# Patient Record
Sex: Female | Born: 1970 | Race: White | Hispanic: No | Marital: Married | Smoking: Never smoker
Health system: Southern US, Community
[De-identification: ages and names within clinical notes are randomized; demographics above are authoritative.]

## PROBLEM LIST (undated history)

## (undated) DIAGNOSIS — G4730 Sleep apnea, unspecified: Secondary | ICD-10-CM

## (undated) DIAGNOSIS — E785 Hyperlipidemia, unspecified: Secondary | ICD-10-CM

## (undated) DIAGNOSIS — T7840XA Allergy, unspecified, initial encounter: Secondary | ICD-10-CM

## (undated) DIAGNOSIS — Z803 Family history of malignant neoplasm of breast: Secondary | ICD-10-CM

## (undated) DIAGNOSIS — F410 Panic disorder [episodic paroxysmal anxiety] without agoraphobia: Secondary | ICD-10-CM

## (undated) DIAGNOSIS — Z86010 Personal history of colonic polyps: Secondary | ICD-10-CM

## (undated) DIAGNOSIS — I10 Essential (primary) hypertension: Secondary | ICD-10-CM

## (undated) DIAGNOSIS — K219 Gastro-esophageal reflux disease without esophagitis: Secondary | ICD-10-CM

## (undated) DIAGNOSIS — F419 Anxiety disorder, unspecified: Secondary | ICD-10-CM

## (undated) DIAGNOSIS — R011 Cardiac murmur, unspecified: Secondary | ICD-10-CM

## (undated) HISTORY — DX: Hyperlipidemia, unspecified: E78.5

## (undated) HISTORY — DX: Personal history of colonic polyps: Z86.010

## (undated) HISTORY — PX: POLYPECTOMY: SHX149

## (undated) HISTORY — DX: Anxiety disorder, unspecified: F41.9

## (undated) HISTORY — DX: Panic disorder (episodic paroxysmal anxiety): F41.0

## (undated) HISTORY — PX: COLONOSCOPY: SHX174

## (undated) HISTORY — PX: DILATION AND CURETTAGE OF UTERUS: SHX78

## (undated) HISTORY — DX: Allergy, unspecified, initial encounter: T78.40XA

## (undated) HISTORY — DX: Gastro-esophageal reflux disease without esophagitis: K21.9

## (undated) HISTORY — DX: Sleep apnea, unspecified: G47.30

## (undated) HISTORY — DX: Family history of malignant neoplasm of breast: Z80.3

## (undated) HISTORY — DX: Essential (primary) hypertension: I10

## (undated) HISTORY — DX: Cardiac murmur, unspecified: R01.1

---

## 1998-01-22 ENCOUNTER — Other Ambulatory Visit: Admission: RE | Admit: 1998-01-22 | Discharge: 1998-01-22 | Payer: Self-pay | Admitting: Obstetrics & Gynecology

## 1998-01-22 ENCOUNTER — Inpatient Hospital Stay (HOSPITAL_COMMUNITY): Admission: AD | Admit: 1998-01-22 | Discharge: 1998-01-22 | Payer: Self-pay | Admitting: Obstetrics & Gynecology

## 1998-01-25 ENCOUNTER — Ambulatory Visit (HOSPITAL_COMMUNITY): Admission: RE | Admit: 1998-01-25 | Discharge: 1998-01-25 | Payer: Self-pay | Admitting: Obstetrics & Gynecology

## 1998-08-14 ENCOUNTER — Emergency Department (HOSPITAL_COMMUNITY): Admission: EM | Admit: 1998-08-14 | Discharge: 1998-08-14 | Payer: Self-pay | Admitting: Emergency Medicine

## 1998-08-15 ENCOUNTER — Emergency Department (HOSPITAL_COMMUNITY): Admission: EM | Admit: 1998-08-15 | Discharge: 1998-08-15 | Payer: Self-pay | Admitting: Emergency Medicine

## 1999-04-29 ENCOUNTER — Inpatient Hospital Stay (HOSPITAL_COMMUNITY): Admission: AD | Admit: 1999-04-29 | Discharge: 1999-04-29 | Payer: Self-pay | Admitting: Obstetrics and Gynecology

## 1999-06-07 ENCOUNTER — Other Ambulatory Visit: Admission: RE | Admit: 1999-06-07 | Discharge: 1999-06-07 | Payer: Self-pay | Admitting: Obstetrics & Gynecology

## 1999-09-28 ENCOUNTER — Inpatient Hospital Stay (HOSPITAL_COMMUNITY): Admission: AD | Admit: 1999-09-28 | Discharge: 1999-09-28 | Payer: Self-pay | Admitting: Obstetrics and Gynecology

## 1999-09-28 ENCOUNTER — Other Ambulatory Visit: Admission: RE | Admit: 1999-09-28 | Discharge: 1999-09-28 | Payer: Self-pay | Admitting: Obstetrics and Gynecology

## 1999-11-22 ENCOUNTER — Inpatient Hospital Stay (HOSPITAL_COMMUNITY): Admission: AD | Admit: 1999-11-22 | Discharge: 1999-11-22 | Payer: Self-pay | Admitting: Obstetrics & Gynecology

## 1999-11-30 ENCOUNTER — Inpatient Hospital Stay (HOSPITAL_COMMUNITY): Admission: AD | Admit: 1999-11-30 | Discharge: 1999-12-04 | Payer: Self-pay | Admitting: Obstetrics and Gynecology

## 1999-12-06 ENCOUNTER — Inpatient Hospital Stay (HOSPITAL_COMMUNITY): Admission: AD | Admit: 1999-12-06 | Discharge: 1999-12-09 | Payer: Self-pay | Admitting: Obstetrics & Gynecology

## 2000-06-13 ENCOUNTER — Other Ambulatory Visit: Admission: RE | Admit: 2000-06-13 | Discharge: 2000-06-13 | Payer: Self-pay | Admitting: Obstetrics and Gynecology

## 2001-06-14 ENCOUNTER — Other Ambulatory Visit: Admission: RE | Admit: 2001-06-14 | Discharge: 2001-06-14 | Payer: Self-pay | Admitting: Obstetrics and Gynecology

## 2002-07-21 ENCOUNTER — Inpatient Hospital Stay (HOSPITAL_COMMUNITY): Admission: AD | Admit: 2002-07-21 | Discharge: 2002-07-21 | Payer: Self-pay | Admitting: Obstetrics and Gynecology

## 2002-09-08 ENCOUNTER — Inpatient Hospital Stay (HOSPITAL_COMMUNITY): Admission: AD | Admit: 2002-09-08 | Discharge: 2002-09-08 | Payer: Self-pay | Admitting: Obstetrics and Gynecology

## 2002-09-19 ENCOUNTER — Inpatient Hospital Stay (HOSPITAL_COMMUNITY): Admission: AD | Admit: 2002-09-19 | Discharge: 2002-09-19 | Payer: Self-pay | Admitting: Obstetrics and Gynecology

## 2002-10-04 ENCOUNTER — Inpatient Hospital Stay (HOSPITAL_COMMUNITY): Admission: AD | Admit: 2002-10-04 | Discharge: 2002-10-06 | Payer: Self-pay | Admitting: Obstetrics and Gynecology

## 2003-01-07 ENCOUNTER — Other Ambulatory Visit: Admission: RE | Admit: 2003-01-07 | Discharge: 2003-01-07 | Payer: Self-pay | Admitting: Obstetrics and Gynecology

## 2005-06-19 ENCOUNTER — Other Ambulatory Visit: Admission: RE | Admit: 2005-06-19 | Discharge: 2005-06-19 | Payer: Self-pay | Admitting: Obstetrics and Gynecology

## 2008-03-03 ENCOUNTER — Encounter: Admission: RE | Admit: 2008-03-03 | Discharge: 2008-03-03 | Payer: Self-pay | Admitting: Family Medicine

## 2010-11-13 ENCOUNTER — Encounter: Payer: Self-pay | Admitting: Family Medicine

## 2011-03-10 NOTE — H&P (Signed)
NAMEDELENN, AHN                          ACCOUNT NO.:  [id number]   MEDICAL RECORD NO.:  [id number]                   PATIENT TYPE:  INP   LOCATION:  9162                                 FACILITY:  [facility]   PHYSICIAN:  Osborn Coho, M.D.                DATE OF BIRTH:  15-Jul-1971   DATE OF ADMISSION:  10/04/2002  DATE OF DISCHARGE:                                HISTORY & PHYSICAL   HISTORY OF PRESENT ILLNESS:  The patient is a 40 year old gravida 5, para 1-  0-3-1 at 38-5/7 weeks, who presents with uterine contractions every three  minutes for the last several hours.  She denies headache, visual symptoms,  epigastric pain, leaking or bleeding.  She reports positive fetal movement.   PREGNANCY RISKS:  The pregnancy is remarkable for:  1. PIH with elevated blood pressure beginning at 35 weeks.  She has been on     Aldomet 250 mg t.i.d.  2. History of PIH with last pregnancy at 28 weeks, on Aldomet; then     developed pre-eclampsia at 37 weeks.  3. Probable LGA with polyhydramnios at 36 weeks, and her growth in the 75th     through 90th percentile.  4. Rh negative with RhoGAM received.  5. Positive Group B strep.  6. History of anxiety disorder.  7. History of two SABs and one TAB.   PRENATAL LABORATORY DATA:  Blood type A negative.  Rh antibody negative.  VDRL nonreactive.  Rubella titer positive.  Hepatitis B surface antigen  negative.  HIV nonreactive.  Cystic fibrosis testing negative.  GC and  chlamydia culChlamydiae normal.  Glucose challenge was elevated.  Three-hour  GTT was within normal limits.  AFP was declined.  Hemoglobin _____________  was 13.7; it was within normal limits at 28 weeks.  EDC of October 13, 2002; was established by last menstrual period and was in agreement with  ultrasound at approximately 14 weeks.   HISTORY OF PRESENT PREGNANCY:  The patient entered care at approximately 10  weeks.  She had an ultrasound of approximately 14 weeks, secondary  to  history of two SABs.  AFP was declined.  She had to have a three-hour GTT  secondary to abnormal one hour GTT.  She began to have slight elevation of  blood pressure at 31 weeks, but no other significant findings; no labs were  out of normal limits.  Platelet count was 144 at 33 weeks.  Positive beta  strep was noted at 34 weeks.   The patient continued to have some issues of glycosuria and slightly  elevated two RPCs ; however, she delivered before follow-up with nutritional  management center could occur.   OBSTETRICAL HISTORY:  Denies anxiety.  The patient had a therapeutic  termination of pregnancy at nine weeks in 1997.  She had a miscarriage at 12  weeks, with a D&C by Dr. Pennie Rushing in 1999.  She had a spontaneous miscarriage  at four weeks without complication.  In 2001 she had a vaginal birth of a  female infant, weight 7 pounds even at 37 weeks; she was in labor for 24 hours  with epidural anesthesia and she was induced secondary to pre-eclampsia and  was on magnesium sulfate.  She also received RhoGAM in all of her previous  pregnancies.  She had an abnormal Pap during one of her previous  pregnancies, and repeat was normal.  She did note elevated blood pressure at  28 weeks, and was treated with Aldomet in her fourth pregnancy.  She had  elevated blood pressure at 28 weeks, was treated with Aldomet and then  developed pre-eclampsia at 37 weeks.  She was readmitted five days after  delivery for worsening of pre-eclampsia.  The patient has a history of  infertility with the previous pregnancy.   PAST MEDICAL HISTORY:  The patient has an anxiety disorder and is on Paxil.  The patient had her wisdom teeth removed at age 11; a TAB x 1 and D&C with a  SAB x1.   ALLERGIES:  CIPRO, which causes hives.  ALEVE, which causes a rash.   FAMILY HISTORY:  Maternal grandmother had an MI.  Maternal sister had  stroke.  Father and sister with history of hypertension.  Mother has type 2   diabetes adult-onset.  The patient's sister has seizures and a stroke.  The  patient's sister also has cancer of the eyes and mouth.  The patient's  mother is a smoker.   GENETIC HISTORY:  Unremarkable.   SOCIAL HISTORY:  The patient is married to the father of the baby.  He is  involved and supportive (name:  Tationa Stech).  The patient is a homemaker.  Her husband is self-employed.  They both have college educations.   PHYSICAL EXAMINATION:  VITAL SIGNS:  Blood pressure 158/109, 148/103.  Other  vital signs are stable.  HEENT:  Within normal limits.  LUNGS:  Bilateral breath sounds are clear.  HEART:  Regular rate and rhythm; without murmur.  BREASTS:  Soft and nontender.  ABDOMEN:  Fundal height is approximately 40 cm.  Estimated fetal weight is  7.5 to 8.5 pounds.  Uterine contractions are every 2-4 min in a consistent  pattern.  EXTREMITIES:  Within normal limits.  Deep tendon reflexes are 2-3+ with  clonus.  There is approximately no beats of clonus bilaterally.   LABORATORY DATA:  _________________ urine is negative for protein.  CBC  shows hemoglobin 13.8, hematocrit 40, WBC 9.4, platelet count 137.   MONITORING:  Fetal heart rate is reactive and no decelerations.  Uterine  contractions are every three minutes and moderate in quality.  Cervix is 5  cm, slightly posterior, 80% vertex and -2 station.  Bulging bag of water.   ASSESSMENT/IMPRESSION:  1. Intrauterine pregnancy at 38-5/7 weeks.  2. Hypertension.  3. Rh negative.  4. Possible LGA with polyhydramnios.   PLAN:  1. Admit to birthing suite for consult with Dr. Su Hilt as attending     physician.  2. Routine physician orders.  3. Check PIH labs.  4. The patient desires epidural; will place this ASAP.  5. Penicillin prophylaxis for standard TBS dosing.  6. Dr. Su Hilt will follow this patient.     Renaldo Reel Emilee Hero, C.N.M.                   Osborn Coho, M.D.   VLL/MEDQ  D:  10/04/2002  T:  10/04/2002   Job:  [id number]

## 2011-03-10 NOTE — H&P (Signed)
[facility]  Patient:    [name], [name]                       MRN: [id number] Adm. Date:  [id number] Attending:  Cleatrice Burke Dictator:   Mack Guise, C.N.M.                         History and Physical  HISTORY OF PRESENT ILLNESS:   [name] is a 40 year old gravida 4, para 1-0-3-1, status post spontaneous vaginal delivery on December 01, 1999 after induction for chronic hypertension exacerbated by preeclampsia and decreased platelets.  The patient was seen at home on December 06, 1999 by home health nurse with a blood  pressure of 160/110 and 3+ proteinuria and hyperreflexia.  [name] has been taking Aldomet 250 mg p.o. t.i.d. and is readmitted for magnesium sulfate treatment.  PHYSICAL EXAMINATION:  VITAL SIGNS:                  Her blood pressures have been from 140s-170/70s-100. Afebrile.  HEENT:                        Unremarkable.  She is not complaining of any headache.  Some blurred vision on admission, right greater than the left.  LUNGS:                        Clear.  HEART:                        Regular rate and rhythm.  EXTREMITIES:                  DTRs 3+ and one beat of clonus.  LABORATORY DATA:              Lab works finds uric acid 6.5, LDH 272.  WBC is 11.4, hemoglobin and hematocrit 11.8 and 33.6, platelet count 172,000.  SGOT is 36, SGPT is 47.  ASSESSMENT:                   1. Five days postpartum.                               2. Chronic hypertension exacerbated by preeclampsia.  PLAN:                         Admit per Dr. Brita Romp. Hudson-Fraley.  Magnesium sulfate 4 g IV bolus, then 2 g/hr.  Strict I&O.  Regular diet.  IV total 125 cc/hr. PIH panel.  Bedrest with bathroom privileges.  Twenty-four-hour urine for creatinine clearance and total protein.  Dr. Brita Romp. Hudson-Fraley to follow. DD:  12/07/99 TD:  12/07/99 Job: [id number]

## 2011-03-10 NOTE — H&P (Signed)
[name], [name]                          ACCOUNT NO.:  [id number]   MEDICAL RECORD NO.:  [id number]                   PATIENT TYPE:   LOCATION:                                       FACILITY:  [facility]   PHYSICIAN:  Janine Limbo, M.D.            DATE OF BIRTH:  1971-08-13   DATE OF ADMISSION:  10/06/2002  DATE OF DISCHARGE:                                HISTORY & PHYSICAL   HISTORY OF PRESENT ILLNESS:  The patient is a 40 year old female gravida 5,  para 1-0-3-1 who presents at [redacted] weeks gestation ([facility] October 13, 2002) for  induction of labor.  The patient has been followed at [facility] and Gynecology for this pregnancy that has been complicated by  second trimester pregnancy induced hypertension.  The patient is currently  taking Aldomet 250 mg t.i.d.  She has a history of pregnancy induced  hypertension and preeclampsia with her previous pregnancy.  She is Rh  negative and she received RhoGAM at [redacted] weeks gestation.  She has a history  of an anxiety disorder, but is doing quite well so far.  She has had a  positive beta Strep culture during this pregnancy and a positive beta Strep  culture with her prior pregnancy.   OBSTETRICAL HISTORY:  In 1989 the patient had a first trimester elective  pregnancy termination.  In 1997 and in 1999 the patient had a first  trimester miscarriage.  In 2001 the patient had a vaginal delivery of a 7  pound female infant at [redacted] weeks gestation.  That pregnancy was complicated by  preeclampsia.   ALLERGIES:  CIPRO causes hives and ALEVE causes a rash.   PAST MEDICAL HISTORY:  The patient has a history of pregnancy induced  hypertension as mentioned above.  Otherwise, she is quite healthy except for  her anxiety disorder.  The patient had her wisdom teeth removed at age 19.  She had a D&C associated with one of her miscarriages and her elective  pregnancy termination.   SOCIAL HISTORY:  The patient denies cigarette use,  alcohol use, and  recreational drug use.   REVIEW OF SYSTEMS:  Normal pregnancy complaints.   FAMILY HISTORY:  Noncontributory.   PHYSICAL EXAMINATION:  VITAL SIGNS:  Weight 226 pounds.  HEENT:  Within normal limits.  CHEST:  Clear.  HEART:  Regular rate and rhythm.  BREASTS:  Without masses.  ABDOMEN:  Gravid with a fundal height of 38 cm.  EXTREMITIES:  Grossly normal.  NEUROLOGIC:  Grossly normal.  PELVIC:  Cervix is 3 cm dilated, 75% effaced, and -3 in station.  The vertex  is presenting.   LABORATORY VALUES:  Blood type is A-.  Antibody screen negative.  VDRL  nonreactive.  Rubella immune.  HBSAG negative.  HIV nonreactive.  GC  negative.  Chlamydia negative.  Glucola screen was 153 but her three hour  GTT was within normal  limits.  Her third trimester beta Strep culture was  positive.   ASSESSMENT:  1. A [redacted] week gestation.  2. Pregnancy induced hypertension.  3. History of preeclampsia and pregnancy induced hypertension.  4. Blood type A-.  5. Anxiety disorder.   PLAN:  The patient will be admitted for induction of labor.  She understands  the indications for her procedure and she accepts the associated risks.  The  patient will receive beta Strep prophylaxis.                                               Janine Limbo, M.D.    AVS/MEDQ  D:  10/01/2002  T:  10/01/2002  Job:  [id number]

## 2011-03-10 NOTE — H&P (Signed)
[facility]  Patient:    [name], [name]                       MRN: [id number] Adm. Date:  [id number] Attending:  Pleas Koch Dictator:   Vance Gather Duplantis, C.N.M.                         History and Physical  HISTORY OF PRESENT ILLNESS:       Ms. Heinkel is a 40 year old married white female, gravida 4, para 0-0-3-0 who presents for induction of labor secondary to chronic hypertension exacerbated by preeclampsia.  She was seen in the office earlier today and at that time was noted to have a blood pressure of 150/100, protein +1 in a  catheter specimen, and no uterine contractions at that time.  She denies any nausea, vomiting, headaches, or visual disturbances.  She reports positive fetal movement.  Her pregnancy has been followed at [facility] by the MD  service and has been complicated by history of chronic hypertension, greater than 2 ABs, and Rh negative and GBS positive.  OBSTETRICAL HISTORY:              She is a gravida 4, para 0-0-3-0 who had an elective AB in 1989, a miscarriage in 1997, and subsequent miscarriage in 1999, and this current pregnancy.  She is Rh negative.  She reports having had RhoGAM in he past.  She has also had a history of infertility and required an HSG prior to this pregnancy.  ALLERGIES:                        CIPRO.  PAST MEDICAL HISTORY:             She reports having had the usual childhood disease.  She reports a history of hypertension throughout this pregnancy but did not have a history of hypertension prior to this pregnancy per her report.  She  reports a history of anxiety disorder in the past and not currently treated.  FAMILY HISTORY:                   Significant for maternal grandmother with heart attack and stroke, maternal father and sister with hypertension, maternal mother with type 2 diabetes, sister with seizures, sister with cancer, and mother is a  smoker.  Her  genetic history is negative.  SOCIAL HISTORY:                   She is married to [employer] who is involved and supportive.  He is involved full time as a Proofreader.  She is employed full time as a Runner, broadcasting/film/video.  They are of the [place] faith.  They deny any illicit drug use, alcohol, or smoking with this pregnancy.  PRENATAL LABORATORY DATA:         Her blood type is A negative.  Antibody screen was positive passively after having received RhoGAM; syphilis is nonreactive, rubella immune, hepatitis B surface antigen is negative, HIV is nonreactive, GC and chlamydia are both negative.  Pap had ______ with slight atypia.  Urine culture  was negative.  One-hour glucola was negative.  A 36-week beta Strep was positive.  PHYSICAL EXAMINATION:  VITAL SIGNS:                      Blood pressure in the hospital  initially was 167/105.  After an hour of observation, it came down to 144/95.  Her temperature is 97.9.  Her pulse is 124, and her respirations are 20.  HEENT:                            Grossly within normal limits.  HEART:                            Regular rhythm and rate.  CHEST:                            Clear.  BREASTS:                          Soft and nontender.  ABDOMEN:                          Gravid with no right upper quadrant tenderness noted.  Her uterine contractions are mild and irregular.  Her fetal heart rate s reactive and reassuring.  PELVIC:                           Fingertip, 50% vertex, -2.  EXTREMITIES:                      She has 3+ reflexes with no clonus and no significant edema.  LABORATORY DATA:                  Urine protein was 1+ in a catheter specimen at the office.  Other labs are currently pending.  ASSESSMENT:                       1. Intrauterine pregnancy at 37 weeks.                                   2. Chronic hypertension with preeclampsia                                      exacerbation.                                    3. Positive group B Streptococcus.  PLAN:                             Admit to [facility] for induction of labor with Cytotec per Dr. Trudie Reed who will follow the patient. DD:  11/30/99 TD:  11/30/99 Job: [id number]

## 2011-03-10 NOTE — Discharge Summary (Signed)
[facility]  Patient:    [name], [name]                       MRN: [id number] Adm. Date:  [id number] Disc. Date: [id number] Attending:  Pleas Koch Dictator:   Nigel Bridgeman, C.N.M.                           Discharge Summary  ADMITTING DIAGNOSES:          1. Intrauterine pregnancy at term.                               2. Chronic hypertension with superimposed                                  preeclampsia.                               3. Positive group B strep.  DISCHARGE DIAGNOSES:          1. Resolving preeclampsia.                               2. Chronic hypertension.  PROCEDURES:                   1. Cytotec induction.                               2. Artificial rupture of membranes.                               3. Normal spontaneous vaginal birth.                               4. Epidural anesthesia.                               5. Magnesium sulfate therapy.  HOSPITAL COURSE:              Ms. Karas is a 40 year old gravida 4 para 0-0-3-0 who presented for induction of labor on November 30, 1999 secondary to chronic hypertension exacerbated by preeclampsia.  Pregnancy had been remarkable for: 1. Chronic hypertension.  2. Infertility.  3. Three SABs.  4. Rh negative. 5. Group B strep positive.  On admission, patients blood pressure was 160/90, fetal heart rate was reactive.  Cervix was 1-2 cm, 60%, vertex, at a -1 station. There was Cytotec placed during the evening of November 30, 1999.  On the morning of December 01, 1999, cervix was 3, 90%.  Artificial rupture of membranes was accomplished.  Platelet count was 115.  Patient had an epidural placed and progressed well to fully dilated at approximately 5 p.m.  She eventually delivered, after approximately greater than three hours second stage of labor, a viable female by the name of Alex, weight 7 pounds even.  Fetal heart rate was reactive throughout.  Apgars were 2, 7, and 8, with  the fetus having minimal respiratory  effort and poor heart rate at delivery.  The infant responded well to immediate  resuscitative efforts and was taken to the newborn nursery in good condition. Circumcision was planned.  Magnesium sulfate therapy had been initiated for preeclampsia since the patient had proteinuria.  This was continued for 24 hours. On day #1 post delivery, her blood pressure was 155/95, her hemoglobin was 12.1, her platelet count was 97,000.  Urine output was adequate.  Reflexes were 4+ with two beats of clonus.  There was 1-2+ edema.  Magnesium sulfate therapy was continued at that time, but was discontinued on December 03, 1999, at which time reflexes were normal.  The decision was made to observe her blood pressure to determine if continuing of her predelivery Aldomet dosing was required.  On postpartum day #2, patient was up ad lib.  She was breast feeding.  She had no headache, visual symptoms, or epigastric pain.  Her blood pressure in the morning was 160/100.  It had been ranging from 140/80 to 160/100.  She was afebrile. Her physical exam was within normal limits.  Her reflexes were within normal limits. Consultation was made with Dr. Stefano Gaul, and a decision was made to continue her Aldomet 250 mg p.o. t.i.d.  She was deemed to be ready for discharge, and was discharged home on December 04, 1999.  DISCHARGE INSTRUCTIONS:       Per [facility] handout.  Review of preeclampsia signs and symptoms was undertaken.  DISCHARGE MEDICATIONS:        1. Motrin 600 mg p.o. q.6h. p.r.n. pain.                               2. Aldomet 250 mg p.o. t.i.d.  DISCHARGE FOLLOW-UP:          Will occur on December 09, 1999 in the office for a blood pressure check, and at six weeks for routine postpartum exam. DD:  12/04/99 TD:  12/05/99 Job: [id number]

## 2011-03-27 ENCOUNTER — Inpatient Hospital Stay (HOSPITAL_COMMUNITY)
Admission: AD | Admit: 2011-03-27 | Discharge: 2011-03-27 | Disposition: A | Discharge status: Home / Self Care | Payer: BC Managed Care – PPO | Source: Ambulatory Visit | Attending: Obstetrics and Gynecology | Admitting: Obstetrics and Gynecology

## 2011-03-27 DIAGNOSIS — Z298 Encounter for other specified prophylactic measures: Secondary | ICD-10-CM | POA: Insufficient documentation

## 2011-03-27 DIAGNOSIS — Z3480 Encounter for supervision of other normal pregnancy, unspecified trimester: Secondary | ICD-10-CM | POA: Insufficient documentation

## 2011-03-27 DIAGNOSIS — Z8759 Personal history of other complications of pregnancy, childbirth and the puerperium: Secondary | ICD-10-CM | POA: Insufficient documentation

## 2011-03-27 DIAGNOSIS — Z2989 Encounter for other specified prophylactic measures: Secondary | ICD-10-CM | POA: Insufficient documentation

## 2011-03-28 LAB — RH IMMUNE GLOBULIN WORKUP (NOT WOMEN'S HOSP): ABO/RH(D): A NEG

## 2011-09-04 ENCOUNTER — Other Ambulatory Visit: Payer: Self-pay | Admitting: Obstetrics and Gynecology

## 2011-09-04 DIAGNOSIS — Z1231 Encounter for screening mammogram for malignant neoplasm of breast: Secondary | ICD-10-CM

## 2011-09-29 ENCOUNTER — Ambulatory Visit
Admission: RE | Admit: 2011-09-29 | Discharge: 2011-09-29 | Disposition: A | Discharge status: Home / Self Care | Payer: BC Managed Care – PPO | Source: Ambulatory Visit | Attending: Obstetrics and Gynecology | Admitting: Obstetrics and Gynecology

## 2011-09-29 DIAGNOSIS — Z1231 Encounter for screening mammogram for malignant neoplasm of breast: Secondary | ICD-10-CM

## 2011-11-25 ENCOUNTER — Encounter: Payer: Self-pay | Admitting: Obstetrics and Gynecology

## 2011-11-25 DIAGNOSIS — Z8759 Personal history of other complications of pregnancy, childbirth and the puerperium: Secondary | ICD-10-CM

## 2012-08-30 ENCOUNTER — Other Ambulatory Visit: Payer: Self-pay | Admitting: Obstetrics and Gynecology

## 2012-08-30 DIAGNOSIS — Z1231 Encounter for screening mammogram for malignant neoplasm of breast: Secondary | ICD-10-CM

## 2012-10-08 ENCOUNTER — Ambulatory Visit
Admission: RE | Admit: 2012-10-08 | Discharge: 2012-10-08 | Disposition: A | Discharge status: Home / Self Care | Payer: BC Managed Care – PPO | Source: Ambulatory Visit | Attending: Obstetrics and Gynecology | Admitting: Obstetrics and Gynecology

## 2012-10-08 DIAGNOSIS — Z1231 Encounter for screening mammogram for malignant neoplasm of breast: Secondary | ICD-10-CM

## 2013-07-14 ENCOUNTER — Ambulatory Visit (HOSPITAL_COMMUNITY): Payer: BC Managed Care – PPO

## 2013-09-03 ENCOUNTER — Other Ambulatory Visit: Payer: Self-pay

## 2013-09-03 DIAGNOSIS — Z1231 Encounter for screening mammogram for malignant neoplasm of breast: Secondary | ICD-10-CM

## 2013-10-09 ENCOUNTER — Ambulatory Visit
Admission: RE | Admit: 2013-10-09 | Discharge: 2013-10-09 | Disposition: A | Discharge status: Home / Self Care | Payer: BC Managed Care – PPO | Source: Ambulatory Visit

## 2013-10-09 DIAGNOSIS — Z1231 Encounter for screening mammogram for malignant neoplasm of breast: Secondary | ICD-10-CM

## 2014-08-03 ENCOUNTER — Other Ambulatory Visit: Payer: Self-pay

## 2014-08-03 DIAGNOSIS — Z1239 Encounter for other screening for malignant neoplasm of breast: Secondary | ICD-10-CM

## 2014-10-12 ENCOUNTER — Ambulatory Visit
Admission: RE | Admit: 2014-10-12 | Discharge: 2014-10-12 | Disposition: A | Discharge status: Home / Self Care | Payer: BC Managed Care – PPO | Source: Ambulatory Visit

## 2014-10-12 ENCOUNTER — Ambulatory Visit: Payer: BC Managed Care – PPO

## 2014-10-12 DIAGNOSIS — Z1239 Encounter for other screening for malignant neoplasm of breast: Secondary | ICD-10-CM

## 2015-08-31 ENCOUNTER — Other Ambulatory Visit: Payer: Self-pay

## 2015-08-31 DIAGNOSIS — Z1231 Encounter for screening mammogram for malignant neoplasm of breast: Secondary | ICD-10-CM

## 2015-10-14 ENCOUNTER — Ambulatory Visit: Payer: BC Managed Care – PPO

## 2015-11-01 ENCOUNTER — Ambulatory Visit
Admission: RE | Admit: 2015-11-01 | Discharge: 2015-11-01 | Disposition: A | Discharge status: Home / Self Care | Payer: BC Managed Care – PPO | Source: Ambulatory Visit

## 2015-11-01 DIAGNOSIS — Z1231 Encounter for screening mammogram for malignant neoplasm of breast: Secondary | ICD-10-CM

## 2016-09-18 ENCOUNTER — Other Ambulatory Visit: Payer: Self-pay | Admitting: Obstetrics and Gynecology

## 2016-09-18 DIAGNOSIS — Z1231 Encounter for screening mammogram for malignant neoplasm of breast: Secondary | ICD-10-CM

## 2016-11-01 ENCOUNTER — Ambulatory Visit
Admission: RE | Admit: 2016-11-01 | Discharge: 2016-11-01 | Disposition: A | Discharge status: Home / Self Care | Payer: BC Managed Care – PPO | Source: Ambulatory Visit | Attending: Obstetrics and Gynecology | Admitting: Obstetrics and Gynecology

## 2016-11-01 DIAGNOSIS — Z1231 Encounter for screening mammogram for malignant neoplasm of breast: Secondary | ICD-10-CM

## 2016-11-29 ENCOUNTER — Telehealth: Payer: Self-pay

## 2016-11-29 NOTE — Telephone Encounter (Signed)
SENT NOTE TO SCHEDULING 

## 2017-01-03 ENCOUNTER — Ambulatory Visit: Payer: BC Managed Care – PPO | Admitting: Interventional Cardiology

## 2017-01-03 ENCOUNTER — Encounter: Payer: Self-pay | Admitting: Interventional Cardiology

## 2017-01-11 DIAGNOSIS — R002 Palpitations: Secondary | ICD-10-CM | POA: Insufficient documentation

## 2017-01-11 NOTE — Progress Notes (Addendum)
Cardiology Office Note    Date:  01/12/2017   ID:  [name], DOB 11/13/1970, MRN [id number]  PCP:  Marda Stalker, PA-C  Cardiologist: Sinclair Grooms, MD   Chief Complaint  Patient presents with  . 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   Referred because of a soft systolic murmur. Also some complaint of palpitations. She has no real cardiac complaints. She is referred by her primary care provider because of murmur and irregular heartbeat. She has no real cardiac complaints.She does have some dyspnea on exertion. She has gained weight. She is physically inactive. Positive family history of CAD, mother underwent LAD stenting by Dr. Burt Knack recently at age 22.  Past Medical History:  Diagnosis Date  . Hyperlipidemia   . Hypertension   . Panic disorder     No past surgical history on file.  Current Medications: Outpatient Medications Prior to Visit  Medication Sig Dispense Refill  . losartan-hydrochlorothiazide (HYZAAR) 50-12.5 MG tablet Take 1 tablet by mouth daily.    Marland Kitchen PARoxetine (PAXIL) 10 MG tablet Take 10 mg by mouth daily.    Marland Kitchen guaiFENesin (MUCINEX) 600 MG 12 hr tablet Take 600 mg by mouth 2 (two) times daily.    . traMADol (ULTRAM) 50 MG tablet Take 50 mg by mouth as directed. Ultram 50-100 mg q 4-6 hours prn     No facility-administered medications prior to visit.      Allergies:   Ciprofloxacin   Social History   Social History  . Marital status: Married    Spouse name: N/A  . Number of children: N/A  . Years of education: N/A   Social History Main Topics  . Smoking status: Never Smoker  . Smokeless tobacco: Never Used  . Alcohol use No  . Drug use: No  . Sexual activity: Not Asked   Other Topics Concern  . None   Social History Narrative  . None     Family History:  The patient's family history includes Diabetes Mellitus I  in her maternal grandmother; Heart attack in her paternal grandfather; Heart disease in her maternal grandmother; Hyperlipidemia in her mother; Hypertension in her father, mother, and sister.   ROS:   Please see the history of present illness.    Mild dyspnea on exertion. Weight gain. Stress set work. Snoring, and easy bruising also noted. All other systems reviewed and are negative.   PHYSICAL EXAM:   VS:  BP 134/86 (BP Location: Left Arm)   Pulse 91   Ht 5\' 7"  (1.702 m)   Wt 254 lb (115.2 kg)   BMI 39.78 kg/m    GEN: Well nourished, well developed, in no acute distress . Obese HEENT: normal  Neck: no JVD, carotid bruits, or masses Cardiac: RRR With 1 of 6 right upper sternal border systolic murmur compatible with physiologic flow murmur. The murmur is loudest when lying flat and nearly disappears with standing. No rubs, or gallops,no edema  Respiratory:  clear to auscultation bilaterally, normal work of breathing GI: soft, nontender, nondistended, + BS MS: no deformity or atrophy  Skin: warm and dry, no rash Neuro:  Alert and Oriented x 3, Strength and sensation are intact Psych: euthymic mood, full affect  Wt Readings from Last 3 Encounters:  01/12/17 254 lb (115.2 kg)      Studies/Labs Reviewed:   EKG:  EKG  Normal sinus rhythm, left atrial abnormality, incomplete right bundle branch block  Recent Labs: No results found for requested labs within last 8760 hours.   Lipid Panel No results found for: CHOL, TRIG, HDL, CHOLHDL, VLDL, LDLCALC, LDLDIRECT  Additional studies/ records that were reviewed today include:  Extreme elevation in cholesterol is noted from laboratory contained within the primary referral data..    ASSESSMENT:    1. Heart murmur, systolic   2. Atypical chest pain   3. Hyperlipidemia with target LDL less than 100   4. Palpitations   5. Morbid obesity ([place])      PLAN:  In order of problems listed above:  1. Based upon auscultation, the  murmur represents a physiologic flow murmur. No evaluation is needed. 2. Chest discomfort is atypical, nonexertional, and lasts seconds before resolving. Probably musculoskeletal. 3. Severe elevation in lipids with total cholesterol in September 2017 of 280 and LDL cholesterol 188 with HDL of 64. Given family history of CAD and other risk factor of obesity, would recommend serious attempts that lipid lowering starting with statin therapy. LDL target should be at least 100. We'll refer this back to primary care. I counseled the patient concerning increasing plan based products in her diet, decreasing carbohydrates, and engaging in regular aerobic exercise. 4. Sounds like she is having premature beats that are not clinically bothersome and required no workup. 5. Please see and I'll all come to #3. Also note that with her history of snoring, sleep apnea is a consideration if she develops progressive palpitations, tachycardia, excessive daytime sleepiness, etc.    Medication Adjustments/Labs and Tests Ordered: Current medicines are reviewed at length with the patient today.  Concerns regarding medicines are outlined above.  Medication changes, Labs and Tests ordered today are listed in the Patient Instructions below. Patient Instructions  Medication Instructions:  None  Labwork: None  Testing/Procedures: None  Follow-Up: Your physician recommends that you schedule a follow-up appointment as needed with Dr. Tamala Julian.   Any Other Special Instructions Will Be Listed Below (If Applicable).  Have your PCP follow your lipids and prescribe a cholesterol medication.  Dr. Tamala Julian is recommending that you exercise regularly, follow a plant based diet, and cut back on carbohydrates.   If you need a refill on your cardiac medications before your next appointment, please call your pharmacy.      Signed, Sinclair Grooms, MD  01/12/2017 5:22 PM    [facility] [place], [place],   [address] Phone: [phone]; Fax: [phone]

## 2017-01-12 ENCOUNTER — Encounter: Payer: Self-pay | Admitting: Interventional Cardiology

## 2017-01-12 ENCOUNTER — Ambulatory Visit (INDEPENDENT_AMBULATORY_CARE_PROVIDER_SITE_OTHER): Payer: BC Managed Care – PPO | Admitting: Interventional Cardiology

## 2017-01-12 ENCOUNTER — Encounter (INDEPENDENT_AMBULATORY_CARE_PROVIDER_SITE_OTHER): Payer: Self-pay

## 2017-01-12 VITALS — BP 134/86 | HR 91 | Ht 67.0 in | Wt 254.0 lb

## 2017-01-12 DIAGNOSIS — R0789 Other chest pain: Secondary | ICD-10-CM | POA: Diagnosis not present

## 2017-01-12 DIAGNOSIS — R002 Palpitations: Secondary | ICD-10-CM | POA: Diagnosis not present

## 2017-01-12 DIAGNOSIS — E785 Hyperlipidemia, unspecified: Secondary | ICD-10-CM | POA: Diagnosis not present

## 2017-01-12 DIAGNOSIS — R011 Cardiac murmur, unspecified: Secondary | ICD-10-CM | POA: Diagnosis not present

## 2017-01-12 NOTE — Patient Instructions (Signed)
Medication Instructions:  None  Labwork: None  Testing/Procedures: None  Follow-Up: Your physician recommends that you schedule a follow-up appointment as needed with Dr. Tamala Julian.   Any Other Special Instructions Will Be Listed Below (If Applicable).  Have your PCP follow your lipids and prescribe a cholesterol medication.  Dr. Tamala Julian is recommending that you exercise regularly, follow a plant based diet, and cut back on carbohydrates.   If you need a refill on your cardiac medications before your next appointment, please call your pharmacy.

## 2017-03-06 ENCOUNTER — Ambulatory Visit (INDEPENDENT_AMBULATORY_CARE_PROVIDER_SITE_OTHER): Payer: BC Managed Care – PPO | Admitting: Physician Assistant

## 2017-03-06 ENCOUNTER — Encounter: Payer: Self-pay | Admitting: Physician Assistant

## 2017-03-06 ENCOUNTER — Encounter (INDEPENDENT_AMBULATORY_CARE_PROVIDER_SITE_OTHER): Payer: Self-pay

## 2017-03-06 VITALS — BP 124/72 | HR 84 | Ht 67.0 in | Wt 253.0 lb

## 2017-03-06 DIAGNOSIS — K625 Hemorrhage of anus and rectum: Secondary | ICD-10-CM | POA: Diagnosis not present

## 2017-03-06 DIAGNOSIS — K921 Melena: Secondary | ICD-10-CM | POA: Diagnosis not present

## 2017-03-06 MED ORDER — NA SULFATE-K SULFATE-MG SULF 17.5-3.13-1.6 GM/177ML PO SOLN: 1.0000 | Freq: Once | ORAL | 0 refills | Status: AC

## 2017-03-06 NOTE — Progress Notes (Signed)
Subjective:    Patient ID: [name], female    DOB: 09-12-1971, 46 y.o.   MRN: [id number]  HPI [name] is a pleasant 46 year old white female, new to GI today self-referred for evaluation of rectal bleeding. She has not had any other prior GI evaluation. She has generally been in good health, does have history of hyperlipidemia. Patient states that her current symptoms started about a month ago and that she has been noticing intermittent blood in her stool and blood on the tissue with wiping. She has not had any abdominal pain but says today she has had some mild lower lower abdominal cramping which is new. She has not noticed any changes in her bowel habits and has not had problems with constipation or straining. She has no rectal pain or pressure and no history of hemorrhoids. She says this past Sunday she had an urge for a bowel movement and passed a blood clot with jellylike material prior to a bowel movement. She noticed bright and dark blood mixed in with stool. Family history is positive for a maternal great aunt with colon cancer diagnosed in her 56s, patient's father has had polyps and mother has diverticular disease.  Review of Systems Pertinent positive and negative review of systems were noted in the above HPI section.  All other review of systems was otherwise negative.  Outpatient Encounter Prescriptions as of 03/06/2017  Medication Sig  . losartan-hydrochlorothiazide (HYZAAR) 50-12.5 MG tablet Take 1 tablet by mouth daily.  Marland Kitchen PARoxetine (PAXIL) 10 MG tablet Take 10 mg by mouth daily.  . pravastatin (PRAVACHOL) 40 MG tablet Take 1 tablet by mouth daily.  . Na Sulfate-K Sulfate-Mg Sulf 17.5-3.13-1.6 GM/180ML SOLN Take 1 kit by mouth once.   No facility-administered encounter medications on file as of 03/06/2017.    Allergies  Allergen Reactions  . Ciprofloxacin Rash   Patient Active Problem List   Diagnosis Date Noted  . Hyperlipidemia with target LDL less than 100  01/12/2017  . Morbid obesity ([place]) 01/12/2017  . Atypical chest pain 01/12/2017  . Heart murmur, systolic 19/14/7829  . Palpitations 01/11/2017   Social History   Social History  . Marital status: Married    Spouse name: N/A  . Number of children: 2  . Years of education: N/A   Occupational History  . teacher     Social History Main Topics  . Smoking status: Never Smoker  . Smokeless tobacco: Never Used  . Alcohol use No  . Drug use: No  . Sexual activity: Not on file   Other Topics Concern  . Not on file   Social History Narrative  . No narrative on file    Ms. Bonaventura's family history includes Colon cancer in her other; Colon polyps in her father; Diabetes Mellitus I in her maternal grandmother; Diverticulitis in her mother; GI Bleed in her mother; Heart attack in her mother and paternal grandfather; Heart disease in her maternal grandmother; Hyperlipidemia in her mother; Hypertension in her father, mother, and sister; Irritable bowel syndrome in her mother.      Objective:    Vitals:   03/06/17 1420  BP: 124/72  Pulse: 84    Physical Exam  well-developed white female in no acute distress, pleasant blood pressure 124/72 pulse 84, height 5 foot 7, weight 253, BMI of 39.6. HEENT; nontraumatic normocephalic EOMI PERRLA sclera anicteric, Cardiovascular; regular rate and rhythm with S1-S2 no murmur rub or gallop, Pulmonary ;clear bilaterally, Abdomen; soft, nontender nondistended bowel sounds  are active there is no palpable mass or hepatosplenomegaly, Rectal; exam no external hemorrhoids noted digital exam is negative stool is brown and heme positive. Extremities; no clubbing cyanosis or edema skin warm and dry, Neuropsych ;mood and affect appropriate       Assessment & Plan:   #50 46 year old white female with 1 month history of intermittent blood in her stool, and bright red blood on the tissue. Rectal exam today is negative, stool is heme positive. Rule out occult  colon lesion, rule out bleeding secondary to internal hemorrhoids #2  Hyperlipidemia #3 family history of colon polyps in patient's father  Plan; Patient will be scheduled for colonoscopy with Dr. Silverio Decamp. Procedure was discussed in detail with the patient including risks and benefits and she is agreeable to proceed. Further recommendations pending results of above.  Kealie Barrie S Viktoria Gruetzmacher PA-C 03/06/2017   Cc: Marda Stalker, PA-C

## 2017-03-06 NOTE — Patient Instructions (Signed)
You have been scheduled for a colonoscopy. Please follow written instructions given to you at your visit today.  Please pick up your prep supplies at the pharmacy within the next 1-3 days. [facility].  If you use inhalers (even only as needed), please bring them with you on the day of your procedure. Your physician has requested that you go to www.startemmi.com and enter the access code given to you at your visit today. This web site gives a general overview about your procedure. However, you should still follow specific instructions given to you by our office regarding your preparation for the procedure.

## 2017-03-07 ENCOUNTER — Encounter: Payer: Self-pay | Admitting: Gastroenterology

## 2017-03-13 ENCOUNTER — Telehealth: Payer: Self-pay | Admitting: Gastroenterology

## 2017-03-13 NOTE — Telephone Encounter (Signed)
Pt is on the way to urgent care, thinks she may have a sinus infection. Pt concerned it might be an issue with procedure. Discussed with pt that as long as she didn't have a fever she should be fine. Pt states she has not had a fever.

## 2017-03-15 ENCOUNTER — Ambulatory Visit (AMBULATORY_SURGERY_CENTER): Payer: BC Managed Care – PPO | Admitting: Gastroenterology

## 2017-03-15 ENCOUNTER — Encounter: Payer: Self-pay | Admitting: Gastroenterology

## 2017-03-15 VITALS — BP 116/66 | HR 84 | Temp 98.7°F | Resp 16 | Ht 67.0 in | Wt 253.0 lb

## 2017-03-15 DIAGNOSIS — D124 Benign neoplasm of descending colon: Secondary | ICD-10-CM | POA: Diagnosis not present

## 2017-03-15 DIAGNOSIS — K625 Hemorrhage of anus and rectum: Secondary | ICD-10-CM | POA: Diagnosis present

## 2017-03-15 DIAGNOSIS — D125 Benign neoplasm of sigmoid colon: Secondary | ICD-10-CM

## 2017-03-15 MED ORDER — SODIUM CHLORIDE 0.9 % IV SOLN: 500.0000 mL | INTRAVENOUS | Status: DC

## 2017-03-15 NOTE — Progress Notes (Signed)
Report given to PACU, vss 

## 2017-03-15 NOTE — Op Note (Signed)
[facility] Patient Name: [name] Procedure Date: 03/15/2017 8:43 AM MRN: [id number] Endoscopist: Mauri Pole , MD Age: 46 Referring MD:  Date of Birth: 10-08-1971 Gender: Female Account #: [id number] Procedure:                Colonoscopy Indications:              Evaluation of unexplained GI bleeding Medicines:                Monitored Anesthesia Care Procedure:                Pre-Anesthesia Assessment:                           - Prior to the procedure, a History and Physical                            was performed, and patient medications and                            allergies were reviewed. The patient's tolerance of                            previous anesthesia was also reviewed. The risks                            and benefits of the procedure and the sedation                            options and risks were discussed with the patient.                            All questions were answered, and informed consent                            was obtained. Prior Anticoagulants: The patient has                            taken no previous anticoagulant or antiplatelet                            agents. ASA Grade Assessment: II - A patient with                            mild systemic disease. After reviewing the risks                            and benefits, the patient was deemed in                            satisfactory condition to undergo the procedure.                           After obtaining informed consent, the colonoscope  was passed under direct vision. Throughout the                            procedure, the patient's blood pressure, pulse, and                            oxygen saturations were monitored continuously. The                            Model CF-HQ190L [id number]) scope was introduced                            through the anus and advanced to the the terminal                            ileum, with  identification of the appendiceal                            orifice and IC valve. The colonoscopy was performed                            without difficulty. The patient tolerated the                            procedure well. The quality of the bowel                            preparation was excellent. The terminal ileum,                            ileocecal valve, appendiceal orifice, and rectum                            were photographed. Scope In: 8:49:41 AM Scope Out: 9:18:03 AM Scope Withdrawal Time: 0 hours 25 minutes 39 seconds  Total Procedure Duration: 0 hours 28 minutes 22 seconds  Findings:                 The perianal and digital rectal examinations were                            normal.                           Two sessile polyps were found in the descending                            colon. The polyps were 4 to 6 mm in size. These                            polyps were removed with a cold snare. Resection                            and retrieval were complete.  Two pedunculated polyps were found in the proximal                            sigmoid colon at 35cm and distal descending colon                            45 from anal verge. The polyps were large in size,                            approx 40 mm and 37mm respectively. The distal                            descednign colon with ulcerated mucosa and oozing                            of blood from the tip. The stalk appeared to clear                            of adenomatous tissue for both the polyps. These                            polyps were removed with a hot snare. Resection and                            retrieval were complete. To prevent bleeding after                            the polypectomy, two hemostatic clips were                            successfully placed (MR conditional), 1 at each                            polypectomy site. There was no bleeding at the end                             of the procedure. Area adjacent to both polypectomy                            site was tattooed with an injection of total 5 mL                            of Spot (carbon black).                           Internal hemorrhoids were found during                            retroflexion. The hemorrhoids were small. Complications:            No immediate complications. Estimated Blood Loss:     Estimated blood loss was minimal. Impression:               -  Two 4 to 6 mm polyps in the descending colon,                            removed with a cold snare. Resected and retrieved.                           - Two large polyps in the sigmoid colon and in the                            descending colon, removed with a hot snare.                            Resected and retrieved. Clips (MR conditional) were                            placed. Tattooed.                           - Internal hemorrhoids. Recommendation:           - Patient has a contact number available for                            emergencies. The signs and symptoms of potential                            delayed complications were discussed with the                            patient. Return to normal activities tomorrow.                            Written discharge instructions were provided to the                            patient.                           - Resume previous diet.                           - Continue present medications.                           - Await pathology results.                           - Repeat colonoscopy date to be determined after                            pending pathology results are reviewed for                            surveillance based on pathology results.                           -  Return to GI clinic PRN. Mauri Pole, MD 03/15/2017 9:27:26 AM This report has been signed electronically.

## 2017-03-15 NOTE — Progress Notes (Signed)
Patient instructed to avoid NSAIDS for 2 weeks.

## 2017-03-15 NOTE — Patient Instructions (Signed)
Discharge instructions given. Handouts on polyps and hemorrhoids. Resume previous medications. Clip card given. YOU HAD AN ENDOSCOPIC PROCEDURE TODAY AT [facility]:   Refer to the procedure report that was given to you for any specific questions about what was found during the examination.  If the procedure report does not answer your questions, please call your gastroenterologist to clarify.  If you requested that your care partner not be given the details of your procedure findings, then the procedure report has been included in a sealed envelope for you to review at your convenience later.  YOU SHOULD EXPECT: Some feelings of bloating in the abdomen. Passage of more gas than usual.  Walking can help get rid of the air that was put into your GI tract during the procedure and reduce the bloating. If you had a lower endoscopy (such as a colonoscopy or flexible sigmoidoscopy) you may notice spotting of blood in your stool or on the toilet paper. If you underwent a bowel prep for your procedure, you may not have a normal bowel movement for a few days.  Please Note:  You might notice some irritation and congestion in your nose or some drainage.  This is from the oxygen used during your procedure.  There is no need for concern and it should clear up in a day or so.  SYMPTOMS TO REPORT IMMEDIATELY:   Following lower endoscopy (colonoscopy or flexible sigmoidoscopy):  Excessive amounts of blood in the stool  Significant tenderness or worsening of abdominal pains  Swelling of the abdomen that is new, acute  Fever of 100F or higher   For urgent or emergent issues, a gastroenterologist can be reached at any hour by calling [phone].   DIET:  We do recommend a small meal at first, but then you may proceed to your regular diet.  Drink plenty of fluids but you should avoid alcoholic beverages for 24 hours.  ACTIVITY:  You should plan to take it easy for the rest of today and you  should NOT DRIVE or use heavy machinery until tomorrow (because of the sedation medicines used during the test).    FOLLOW UP: Our staff will call the number listed on your records the next business day following your procedure to check on you and address any questions or concerns that you may have regarding the information given to you following your procedure. If we do not reach you, we will leave a message.  However, if you are feeling well and you are not experiencing any problems, there is no need to return our call.  We will assume that you have returned to your regular daily activities without incident.  If any biopsies were taken you will be contacted by phone or by letter within the next 1-3 weeks.  Please call us at [phone] if you have not heard about the biopsies in 3 weeks.    SIGNATURES/CONFIDENTIALITY: You and/or your care partner have signed paperwork which will be entered into your electronic medical record.  These signatures attest to the fact that that the information above on your After Visit Summary has been reviewed and is understood.  Full responsibility of the confidentiality of this discharge information lies with you and/or your care-partner.

## 2017-03-15 NOTE — Progress Notes (Signed)
Reviewed and agree with documentation and assessment and plan. K. Veena Maureena Dabbs , MD   

## 2017-03-16 ENCOUNTER — Telehealth: Payer: Self-pay | Admitting: *Deleted

## 2017-03-16 NOTE — Telephone Encounter (Signed)
  Follow up Call-  Call back number 03/15/2017  Post procedure Call Back phone  # [phone]  Permission to leave phone message Yes  Some recent data might be hidden     Patient questions:  Do you have a fever, pain , or abdominal swelling? No. Pain Score  0 *  Have you tolerated food without any problems? Yes.    Have you been able to return to your normal activities? Yes.    Do you have any questions about your discharge instructions: Diet   No. Medications  No. Follow up visit  No.  Do you have questions or concerns about your Care? Yes.    Actions: * If pain score is 4 or above: No action needed, pain <4.  Pt had questions about when path results would be back.

## 2017-09-07 ENCOUNTER — Other Ambulatory Visit: Payer: Self-pay | Admitting: Family Medicine

## 2017-09-07 DIAGNOSIS — Z1231 Encounter for screening mammogram for malignant neoplasm of breast: Secondary | ICD-10-CM

## 2017-10-29 ENCOUNTER — Telehealth: Payer: Self-pay

## 2017-10-29 NOTE — Telephone Encounter (Signed)
SENT REFERRAL TO SCHEDULING 

## 2017-10-30 NOTE — Progress Notes (Deleted)
Cardiology Office Note:    Date:  10/30/2017   ID:  [name], DOB 09/28/1971, MRN [id number]  PCP:  Marda Stalker, PA-C  Cardiologist:  Fransico Him, MD   Referring MD: Marda Stalker, PA-C   No chief complaint on file.   History of Present Illness:    [name] is a 47 y.o. female with a hx of ***  Past Medical History:  Diagnosis Date  . Anxiety   . Hyperlipidemia   . Hypertension   . Panic disorder     Past Surgical History:  Procedure Laterality Date  . DILATION AND CURETTAGE OF UTERUS     for miscarriage    Current Medications: No outpatient medications have been marked as taking for the 10/31/17 encounter (Appointment) with Sueanne Margarita, MD.   Current Facility-Administered Medications for the 10/31/17 encounter (Appointment) with Sueanne Margarita, MD  Medication  . 0.9 %  sodium chloride infusion     Allergies:   Ciprofloxacin   Social History   Socioeconomic History  . Marital status: Married    Spouse name: Not on file  . Number of children: 2  . Years of education: Not on file  . Highest education level: Not on file  Social Needs  . Financial resource strain: Not on file  . Food insecurity - worry: Not on file  . Food insecurity - inability: Not on file  . Transportation needs - medical: Not on file  . Transportation needs - non-medical: Not on file  Occupational History  . Occupation: Pharmacist, hospital   Tobacco Use  . Smoking status: Never Smoker  . Smokeless tobacco: Never Used  Substance and Sexual Activity  . Alcohol use: No  . Drug use: No  . Sexual activity: Not on file  Other Topics Concern  . Not on file  Social History Narrative  . Not on file     Family History: The patient's ***family history includes Breast cancer in her maternal aunt; Colon cancer in her other; Colon polyps in her father; Diabetes Mellitus I in her maternal grandmother; Diverticulitis in her mother; GI Bleed in her mother; Heart attack in her mother  and paternal grandfather; Heart disease in her maternal grandmother; Hyperlipidemia in her mother; Hypertension in her father, mother, and sister; Irritable bowel syndrome in her mother; Lung cancer in her maternal aunt.  ROS:   Please see the history of present illness.    ROS  All other systems reviewed and negative.   EKGs/Labs/Other Studies Reviewed:    The following studies were reviewed today: ***  EKG:  EKG is *** ordered today.  The ekg ordered today demonstrates ***  Recent Labs: No results found for requested labs within last 8760 hours.   Recent Lipid Panel No results found for: CHOL, TRIG, HDL, CHOLHDL, VLDL, LDLCALC, LDLDIRECT  Physical Exam:    VS:  There were no vitals taken for this visit.    Wt Readings from Last 3 Encounters:  03/15/17 253 lb (114.8 kg)  03/06/17 253 lb (114.8 kg)  01/12/17 254 lb (115.2 kg)     GEN: *** Well nourished, well developed in no acute distress HEENT: Normal NECK: No JVD; No carotid bruits LYMPHATICS: No lymphadenopathy CARDIAC: ***RRR, no murmurs, rubs, gallops RESPIRATORY:  Clear to auscultation without rales, wheezing or rhonchi  ABDOMEN: Soft, non-tender, non-distended MUSCULOSKELETAL:  No edema; No deformity  SKIN: Warm and dry NEUROLOGIC:  Alert and oriented x 3 PSYCHIATRIC:  Normal affect   ASSESSMENT:  No diagnosis found. PLAN:    In order of problems listed above:  ***   Medication Adjustments/Labs and Tests Ordered: Current medicines are reviewed at length with the patient today.  Concerns regarding medicines are outlined above.  No orders of the defined types were placed in this encounter.  No orders of the defined types were placed in this encounter.   Signed, Fransico Him, MD  10/30/2017 6:12 PM    [facility]

## 2017-10-31 ENCOUNTER — Other Ambulatory Visit: Payer: Self-pay

## 2017-10-31 ENCOUNTER — Ambulatory Visit: Payer: BC Managed Care – PPO | Admitting: Physician Assistant

## 2017-10-31 ENCOUNTER — Ambulatory Visit: Payer: BC Managed Care – PPO | Admitting: Nurse Practitioner

## 2017-10-31 ENCOUNTER — Emergency Department (HOSPITAL_COMMUNITY): Payer: BC Managed Care – PPO

## 2017-10-31 ENCOUNTER — Ambulatory Visit: Payer: BC Managed Care – PPO | Admitting: Cardiology

## 2017-10-31 ENCOUNTER — Encounter (HOSPITAL_COMMUNITY): Payer: Self-pay | Admitting: Emergency Medicine

## 2017-10-31 DIAGNOSIS — E785 Hyperlipidemia, unspecified: Secondary | ICD-10-CM | POA: Insufficient documentation

## 2017-10-31 DIAGNOSIS — I10 Essential (primary) hypertension: Secondary | ICD-10-CM | POA: Diagnosis not present

## 2017-10-31 DIAGNOSIS — R072 Precordial pain: Secondary | ICD-10-CM | POA: Diagnosis not present

## 2017-10-31 DIAGNOSIS — Z79899 Other long term (current) drug therapy: Secondary | ICD-10-CM | POA: Diagnosis not present

## 2017-10-31 DIAGNOSIS — R079 Chest pain, unspecified: Secondary | ICD-10-CM | POA: Diagnosis present

## 2017-10-31 LAB — CBC
HEMATOCRIT: 40 % (ref 36.0–46.0)
Hemoglobin: 13.7 g/dL (ref 12.0–15.0)
MCH: 28.7 pg (ref 26.0–34.0)
MCHC: 34.3 g/dL (ref 30.0–36.0)
MCV: 83.9 fL (ref 78.0–100.0)
PLATELETS: 188 10*3/uL (ref 150–400)
RBC: 4.77 MIL/uL (ref 3.87–5.11)
RDW: 13.5 % (ref 11.5–15.5)
WBC: 6.2 10*3/uL (ref 4.0–10.5)

## 2017-10-31 LAB — BASIC METABOLIC PANEL
Anion gap: 9 (ref 5–15)
BUN: 16 mg/dL (ref 6–20)
CHLORIDE: 99 mmol/L — AB (ref 101–111)
CO2: 27 mmol/L (ref 22–32)
Calcium: 9.7 mg/dL (ref 8.9–10.3)
Creatinine, Ser: 1.04 mg/dL — ABNORMAL HIGH (ref 0.44–1.00)
GFR calc Af Amer: >60 mL/min (ref >60)
GFR calc non Af Amer: >60 mL/min (ref >60)
Glucose, Bld: 100 mg/dL — ABNORMAL HIGH (ref 65–99)
POTASSIUM: 3.1 mmol/L — AB (ref 3.5–5.1)
SODIUM: 135 mmol/L (ref 135–145)

## 2017-10-31 LAB — I-STAT TROPONIN, ED: Troponin i, poc: 0.01 ng/mL (ref 0.00–0.08)

## 2017-10-31 LAB — I-STAT BETA HCG BLOOD, ED (MC, WL, AP ONLY)

## 2017-10-31 NOTE — ED Triage Notes (Signed)
Pt c/o chest pain that radiates to her left shoulder and arm x 1 week. Recently pt has felt discomfort in her back.  Pain comes and goes, but has been more recent in the last few day. Reports taking pepcid with some relief, but pain returns. Hx HTN, high cholesterol. Denies shortness of breath/nausea/vomiting/diaphoresis.

## 2017-11-01 ENCOUNTER — Ambulatory Visit: Payer: BC Managed Care – PPO | Admitting: Cardiology

## 2017-11-01 ENCOUNTER — Emergency Department (HOSPITAL_COMMUNITY)
Admission: EM | Admit: 2017-11-01 | Discharge: 2017-11-01 | Disposition: A | Discharge status: Home / Self Care | Payer: BC Managed Care – PPO | Attending: Emergency Medicine | Admitting: Emergency Medicine

## 2017-11-01 ENCOUNTER — Encounter (HOSPITAL_COMMUNITY): Payer: Self-pay | Admitting: Emergency Medicine

## 2017-11-01 DIAGNOSIS — R072 Precordial pain: Secondary | ICD-10-CM

## 2017-11-01 LAB — I-STAT TROPONIN, ED: Troponin i, poc: 0.01 ng/mL (ref 0.00–0.08)

## 2017-11-01 MED ORDER — GI COCKTAIL ~~LOC~~
30.0000 mL | Freq: Once | ORAL | Status: AC
  Administered 2017-11-01: 30 mL via ORAL
  Filled 2017-11-01: qty 30

## 2017-11-01 MED ORDER — OMEPRAZOLE 20 MG PO CPDR: 20.0000 mg | DELAYED_RELEASE_CAPSULE | Freq: Every day | ORAL | 0 refills | Status: DC

## 2017-11-01 NOTE — ED Notes (Signed)
Pt verbalizes understanding of d/c instructions. Pt received prescriptions. Pt ambulatory at d/c with all belongings.  

## 2017-11-01 NOTE — ED Notes (Signed)
ED Provider at bedside. 

## 2017-11-01 NOTE — ED Provider Notes (Addendum)
[facility] EMERGENCY DEPARTMENT Provider Note   CSN: [id number] Arrival date & time: 10/31/17  1910     History   Chief Complaint Chief Complaint  Patient presents with  . Chest Pain    HPI [name] is a 47 y.o. female.  The history is provided by the patient.  Chest Pain   This is a new problem. The current episode started more than 1 week ago. The problem occurs constantly (waxing and waning, worst at night.  No exertional component.  ). The problem has not changed since onset.The quality of the pain is described as dull. Pertinent negatives include no abdominal pain, no cough, no diaphoresis, no dizziness, no exertional chest pressure, no irregular heartbeat, no leg pain, no lower extremity edema, no near-syncope, no numbness, no orthopnea, no palpitations, no PND and no shortness of breath. Associated symptoms comments: belching. She has tried nothing for the symptoms. The treatment provided no relief. Risk factors include obesity.  Pertinent negatives for past medical history include no Marfan's syndrome and no stimulant use.  Procedure history is negative for cardiac catheterization.    Past Medical History:  Diagnosis Date  . Anxiety   . Hyperlipidemia   . Hypertension   . Panic disorder     Patient Active Problem List   Diagnosis Date Noted  . Hyperlipidemia with target LDL less than 100 01/12/2017  . Morbid obesity ([place]) 01/12/2017  . Atypical chest pain 01/12/2017  . Heart murmur, systolic 44/81/8563  . Palpitations 01/11/2017    Past Surgical History:  Procedure Laterality Date  . DILATION AND CURETTAGE OF UTERUS     for miscarriage    OB History    No data available       Home Medications    Prior to Admission medications   Medication Sig Start Date End Date Taking? Authorizing Provider  amoxicillin-clavulanate (AUGMENTIN) 875-125 MG tablet  03/13/17   [provider]  losartan-hydrochlorothiazide (HYZAAR)  50-12.5 MG tablet Take 1 tablet by mouth daily.    [provider]  PARoxetine (PAXIL) 10 MG tablet Take 10 mg by mouth daily.    [provider]  pravastatin (PRAVACHOL) 40 MG tablet Take 1 tablet by mouth daily. 01/15/17   [provider]    Family History Family History  Problem Relation Age of Onset  . Hyperlipidemia Mother   . Hypertension Mother   . Heart attack Mother        May 2017, smoker  . GI Bleed Mother        Aug. 2017  . Diverticulitis Mother        Aug. 2017  . Irritable bowel syndrome Mother   . Hypertension Father   . Colon polyps Father   . Hypertension Sister   . Heart disease Maternal Grandmother   . Diabetes Mellitus I Maternal Grandmother   . Heart attack Paternal Grandfather   . Colon cancer Other        mat great aunt, dx in her 28's  . Breast cancer Maternal Aunt   . Lung cancer Maternal Aunt     Social History Social History   Tobacco Use  . Smoking status: Never Smoker  . Smokeless tobacco: Never Used  Substance Use Topics  . Alcohol use: No  . Drug use: No     Allergies   Ciprofloxacin   Review of Systems Review of Systems  Constitutional: Negative for diaphoresis.  Respiratory: Negative for cough and shortness of breath.  Cardiovascular: Positive for chest pain. Negative for palpitations, orthopnea, leg swelling, PND and near-syncope.  Gastrointestinal: Negative for abdominal pain.  Neurological: Negative for dizziness and numbness.  All other systems reviewed and are negative.    Physical Exam Updated Vital Signs BP (!) 144/99   Pulse 73   Temp 98 F (36.7 C)   Resp 16   Ht 5\' 7"  (1.702 m)   Wt 110.7 kg (244 lb)   SpO2 100%   BMI 38.22 kg/m   Physical Exam  Constitutional: She is oriented to person, place, and time. She appears well-developed and well-nourished. No distress.  HENT:  Head: Normocephalic and atraumatic.  Mouth/Throat: No oropharyngeal exudate.  Eyes: Conjunctivae and EOM  are normal. Pupils are equal, round, and reactive to light.  Neck: Normal range of motion. Neck supple.  Cardiovascular: Normal rate, regular rhythm, normal heart sounds and intact distal pulses.  Pulmonary/Chest: Effort normal. No stridor. She has no wheezes. She has no rales.  Abdominal: She exhibits no mass. Bowel sounds are increased. There is no tenderness. There is no rebound, no guarding, no tenderness at McBurney's point and negative Murphy's sign.  Musculoskeletal: Normal range of motion. She exhibits no edema, tenderness or deformity.  Neurological: She is alert and oriented to person, place, and time. She displays normal reflexes.  Skin: Skin is warm and dry. Capillary refill takes less than 2 seconds.  Psychiatric: She has a normal mood and affect.     ED Treatments / Results  Labs (all labs ordered are listed, but only abnormal results are displayed)  Results for orders placed or performed during the hospital encounter of 08/65/78  Basic metabolic panel  Result Value Ref Range   Sodium 135 135 - 145 mmol/L   Potassium 3.1 (L) 3.5 - 5.1 mmol/L   Chloride 99 (L) 101 - 111 mmol/L   CO2 27 22 - 32 mmol/L   Glucose, Bld 100 (H) 65 - 99 mg/dL   BUN 16 6 - 20 mg/dL   Creatinine, Ser 1.04 (H) 0.44 - 1.00 mg/dL   Calcium 9.7 8.9 - 10.3 mg/dL   GFR calc non Af Amer >60 >60 mL/min   GFR calc Af Amer >60 >60 mL/min   Anion gap 9 5 - 15  CBC  Result Value Ref Range   WBC 6.2 4.0 - 10.5 K/uL   RBC 4.77 3.87 - 5.11 MIL/uL   Hemoglobin 13.7 12.0 - 15.0 g/dL   HCT 40.0 36.0 - 46.0 %   MCV 83.9 78.0 - 100.0 fL   MCH 28.7 26.0 - 34.0 pg   MCHC 34.3 30.0 - 36.0 g/dL   RDW 13.5 11.5 - 15.5 %   Platelets 188 150 - 400 K/uL  I-stat troponin, ED  Result Value Ref Range   Troponin i, poc 0.01 0.00 - 0.08 ng/mL   Comment 3          I-Stat beta hCG blood, ED  Result Value Ref Range   I-stat hCG, quantitative <5.0 <5 mIU/mL   Comment 3           Dg Chest 2 View  Result Date:  10/31/2017 CLINICAL DATA:  Left shoulder left-sided chest pain. EXAM: CHEST  2 VIEW COMPARISON:  None. FINDINGS: Cardiomediastinal silhouette is normal. Mediastinal contours appear intact. Tortuosity of the aorta. There is no evidence of focal airspace consolidation, pleural effusion or pneumothorax. Osseous structures are without acute abnormality. Soft tissues are grossly normal. IMPRESSION: No active cardiopulmonary disease. Electronically Signed  By: Fidela Salisbury M.D.   On: 10/31/2017 20:05    EKG  EKG Interpretation  Date/Time:  Wednesday October 31 2017 19:17:36 EST Ventricular Rate:  89 PR Interval:  148 QRS Duration: 96 QT Interval:  382 QTC Calculation: 464 R Axis:   38 Text Interpretation:  Normal sinus rhythm Normal ECG When compared with ECG of 08/17/1998, No significant change was found Confirmed by Delora Fuel ([id number]) on 10/31/2017 11:14:45 PM       Radiology Dg Chest 2 View  Result Date: 10/31/2017 CLINICAL DATA:  Left shoulder left-sided chest pain. EXAM: CHEST  2 VIEW COMPARISON:  None. FINDINGS: Cardiomediastinal silhouette is normal. Mediastinal contours appear intact. Tortuosity of the aorta. There is no evidence of focal airspace consolidation, pleural effusion or pneumothorax. Osseous structures are without acute abnormality. Soft tissues are grossly normal. IMPRESSION: No active cardiopulmonary disease. Electronically Signed   By: Fidela Salisbury M.D.   On: 10/31/2017 20:05    Procedures Procedures (including critical care time)  Medications Ordered in ED Medications  gi cocktail (Maalox,Lidocaine,Donnatal) (not administered)     Haert score 1 very low risk for Mace, symptoms highly atypical.  PERC negative wells 0 highly doubt PE in this very low risk patient Final Clinical Impressions(s) / ED Diagnoses  Belching and night time symptoms consistent with GERD.   Return for worsening pain, fevers > 100.4 unrelieved by medication, shortness of breath,  intractable vomiting, or diarrhea, abdominal pain, Inability to tolerate liquids or food, cough, altered mental status or any concerns. No signs of systemic illness or infection. The patient is nontoxic-appearing on exam and vital signs are within normal limits.    I have reviewed the triage vital signs and the nursing notes. Pertinent labs &imaging results that were available during my care of the patient were reviewed by me and considered in my medical decision making (see chart for details).  After history, exam, and medical workup I feel the patient has been appropriately medically screened and is safe for discharge home. Pertinent diagnoses were discussed with the patient. Patient was given return precautions.    Nicholous Girgenti, MD 11/01/17 1914    Veatrice Kells, MD 11/01/17 Kellie Shropshire, Kazuma Elena, MD 11/01/17 [id number]

## 2017-11-02 ENCOUNTER — Ambulatory Visit
Admission: RE | Admit: 2017-11-02 | Discharge: 2017-11-02 | Disposition: A | Discharge status: Home / Self Care | Payer: BC Managed Care – PPO | Source: Ambulatory Visit | Attending: Family Medicine | Admitting: Family Medicine

## 2017-11-02 DIAGNOSIS — Z1231 Encounter for screening mammogram for malignant neoplasm of breast: Secondary | ICD-10-CM

## 2017-11-06 ENCOUNTER — Ambulatory Visit: Payer: BC Managed Care – PPO | Admitting: Interventional Cardiology

## 2017-12-10 ENCOUNTER — Encounter: Payer: Self-pay | Admitting: Gastroenterology

## 2018-03-08 ENCOUNTER — Ambulatory Visit (AMBULATORY_SURGERY_CENTER): Payer: Self-pay | Admitting: *Deleted

## 2018-03-08 ENCOUNTER — Other Ambulatory Visit: Payer: Self-pay

## 2018-03-08 VITALS — Ht 67.0 in | Wt 249.4 lb

## 2018-03-08 DIAGNOSIS — Z86010 Personal history of colonic polyps: Secondary | ICD-10-CM

## 2018-03-08 MED ORDER — NA SULFATE-K SULFATE-MG SULF 17.5-3.13-1.6 GM/177ML PO SOLN: 1.0000 [IU] | Freq: Once | ORAL | 0 refills | Status: AC

## 2018-03-08 NOTE — Progress Notes (Signed)
No egg or soy allergy known to patient  No issues with past sedation with any surgeries  or procedures, no intubation problems  No diet pills per patient No home 02 use per patient  No blood thinners per patient  Pt denies issues with constipation  No A fib or A flutter  EMMI video sent to pt's e mail pt. Declined  

## 2018-03-13 ENCOUNTER — Encounter: Payer: Self-pay | Admitting: Gastroenterology

## 2018-03-22 ENCOUNTER — Other Ambulatory Visit: Payer: Self-pay

## 2018-03-22 ENCOUNTER — Encounter: Payer: Self-pay | Admitting: Genetics

## 2018-03-22 ENCOUNTER — Ambulatory Visit (AMBULATORY_SURGERY_CENTER): Payer: BC Managed Care – PPO | Admitting: Gastroenterology

## 2018-03-22 ENCOUNTER — Telehealth: Payer: Self-pay | Admitting: Genetics

## 2018-03-22 ENCOUNTER — Encounter: Payer: Self-pay | Admitting: Gastroenterology

## 2018-03-22 VITALS — BP 118/60 | HR 61 | Temp 98.2°F | Resp 23 | Ht 67.0 in | Wt 249.0 lb

## 2018-03-22 DIAGNOSIS — K635 Polyp of colon: Secondary | ICD-10-CM | POA: Diagnosis not present

## 2018-03-22 DIAGNOSIS — Z86010 Personal history of colonic polyps: Secondary | ICD-10-CM | POA: Diagnosis not present

## 2018-03-22 DIAGNOSIS — D129 Benign neoplasm of anus and anal canal: Secondary | ICD-10-CM

## 2018-03-22 DIAGNOSIS — D125 Benign neoplasm of sigmoid colon: Secondary | ICD-10-CM | POA: Diagnosis not present

## 2018-03-22 DIAGNOSIS — D128 Benign neoplasm of rectum: Secondary | ICD-10-CM

## 2018-03-22 DIAGNOSIS — Z1509 Genetic susceptibility to other malignant neoplasm: Secondary | ICD-10-CM

## 2018-03-22 DIAGNOSIS — D369 Benign neoplasm, unspecified site: Secondary | ICD-10-CM

## 2018-03-22 MED ORDER — SODIUM CHLORIDE 0.9 % IV SOLN: 500.0000 mL | Freq: Once | INTRAVENOUS | Status: DC

## 2018-03-22 NOTE — Progress Notes (Signed)
Report to PACU, RN, vss, BBS= Clear.  

## 2018-03-22 NOTE — Progress Notes (Signed)
Called to room to assist during endoscopic procedure.  Patient ID and intended procedure confirmed with present staff. Received instructions for my participation in the procedure from the performing physician.  

## 2018-03-22 NOTE — Op Note (Signed)
[name] Procedure Date: 03/22/2018 9:04 AM MRN: [id number] Endoscopist: Mauri Pole , MD Age: 47 Referring MD:  Date of Birth: 1971/10/04 Gender: Female Account #: [id number] Procedure:                Colonoscopy Indications:              High risk colon cancer surveillance: Personal                            history of colonic polyps, High risk colon cancer                            surveillance: Personal history of adenoma (10 mm or                            greater in size), High risk colon cancer                            surveillance: Personal history of multiple (3 or                            more) adenomas Medicines:                Monitored Anesthesia Care Procedure:                Pre-Anesthesia Assessment:                           - Prior to the procedure, a History and Physical                            was performed, and patient medications and                            allergies were reviewed. The patient's tolerance of                            previous anesthesia was also reviewed. The risks                            and benefits of the procedure and the sedation                            options and risks were discussed with the patient.                            All questions were answered, and informed consent                            was obtained. Prior Anticoagulants: The patient has                            taken no previous anticoagulant or antiplatelet  agents. ASA Grade Assessment: II - A patient with                            mild systemic disease. After reviewing the risks                            and benefits, the patient was deemed in                            satisfactory condition to undergo the procedure.                           After obtaining informed consent, the colonoscope                            was passed under direct vision. Throughout the                        procedure, the patient's blood pressure, pulse, and                            oxygen saturations were monitored continuously. The                            Colonoscope was introduced through the anus and                            advanced to the the cecum, identified by                            appendiceal orifice and ileocecal valve. The                            colonoscopy was performed without difficulty. The                            patient tolerated the procedure well. The quality                            of the bowel preparation was excellent. The                            ileocecal valve, appendiceal orifice, and rectum                            were photographed. Scope In: 9:09:55 AM Scope Out: 9:24:40 AM Scope Withdrawal Time: 0 hours 11 minutes 9 seconds  Total Procedure Duration: 0 hours 14 minutes 45 seconds  Findings:                 The perianal and digital rectal examinations were                            normal.  A 5 mm polyp was found in the sigmoid colon near                            prior polypectomy site identified by tattoo. The                            polyp was sessile. The polyp was removed with a                            cold snare. Resection and retrieval were complete.                           A 1 mm polyp was found in the sigmoid colon. The                            polyp was sessile. The polyp was removed with a                            cold biopsy forceps. Resection and retrieval were                            complete.                           A 5 mm polyp was found in the recto-sigmoid colon.                            The polyp was sessile. The polyp was removed with a                            cold snare. Resection and retrieval were complete.                           A tattoo was seen in the rectum. A post-polypectomy                            scar was found at the tattoo site.                            Non-bleeding internal hemorrhoids were found during                            retroflexion. The hemorrhoids were small. Complications:            No immediate complications. Estimated Blood Loss:     Estimated blood loss was minimal. Impression:               - One 5 mm polyp in the sigmoid colon, removed with                            a cold snare. Resected and retrieved.                           -  One 1 mm polyp in the sigmoid colon, removed with                            a cold biopsy forceps. Resected and retrieved.                           - One 5 mm polyp at the recto-sigmoid colon,                            removed with a cold snare. Resected and retrieved.                           - A tattoo was seen in the rectum. A                            post-polypectomy scar was found at the tattoo site.                           - Non-bleeding internal hemorrhoids. Recommendation:           - Patient has a contact number available for                            emergencies. The signs and symptoms of potential                            delayed complications were discussed with the                            patient. Return to normal activities tomorrow.                            Written discharge instructions were provided to the                            patient.                           - Resume previous diet.                           - Continue present medications.                           - Await pathology results.                           - Repeat colonoscopy in 3 - 5 years for                            surveillance based on pathology results. Mauri Pole, MD 03/22/2018 9:30:42 AM This report has been signed electronically.

## 2018-03-22 NOTE — Patient Instructions (Signed)
HANDOUTS GIVEN FOR POLYPS AND HEMORRHOIDS  YOU HAD AN ENDOSCOPIC PROCEDURE TODAY AT THE [facility]:   Refer to the procedure report that was given to you for any specific questions about what was found during the examination.  If the procedure report does not answer your questions, please call your gastroenterologist to clarify.  If you requested that your care partner not be given the details of your procedure findings, then the procedure report has been included in a sealed envelope for you to review at your convenience later.  YOU SHOULD EXPECT: Some feelings of bloating in the abdomen. Passage of more gas than usual.  Walking can help get rid of the air that was put into your GI tract during the procedure and reduce the bloating. If you had a lower endoscopy (such as a colonoscopy or flexible sigmoidoscopy) you may notice spotting of blood in your stool or on the toilet paper. If you underwent a bowel prep for your procedure, you may not have a normal bowel movement for a few days.  Please Note:  You might notice some irritation and congestion in your nose or some drainage.  This is from the oxygen used during your procedure.  There is no need for concern and it should clear up in a day or so.  SYMPTOMS TO REPORT IMMEDIATELY:   Following lower endoscopy (colonoscopy or flexible sigmoidoscopy):  Excessive amounts of blood in the stool  Significant tenderness or worsening of abdominal pains  Swelling of the abdomen that is new, acute  Fever of 100F or higher   For urgent or emergent issues, a gastroenterologist can be reached at any hour by calling [phone].   DIET:  We do recommend a small meal at first, but then you may proceed to your regular diet.  Drink plenty of fluids but you should avoid alcoholic beverages for 24 hours.  ACTIVITY:  You should plan to take it easy for the rest of today and you should NOT DRIVE or use heavy machinery until tomorrow (because of the  sedation medicines used during the test).    FOLLOW UP: Our staff will call the number listed on your records the next business day following your procedure to check on you and address any questions or concerns that you may have regarding the information given to you following your procedure. If we do not reach you, we will leave a message.  However, if you are feeling well and you are not experiencing any problems, there is no need to return our call.  We will assume that you have returned to your regular daily activities without incident.  If any biopsies were taken you will be contacted by phone or by letter within the next 1-3 weeks.  Please call us at [phone] if you have not heard about the biopsies in 3 weeks.    SIGNATURES/CONFIDENTIALITY: You and/or your care partner have signed paperwork which will be entered into your electronic medical record.  These signatures attest to the fact that that the information above on your After Visit Summary has been reviewed and is understood.  Full responsibility of the confidentiality of this discharge information lies with you and/or your care-partner. 

## 2018-03-22 NOTE — Progress Notes (Signed)
CALLED BETH AT DR NANDIGAM'S OFFICE TO REQUEST GENETIC TESTING/COUNSELING FOR PT.  STATED THAT SHE'LL CONTACT THEM.  ADVISED PT TO EXPECT A CALL IN A WEEK OR SO.

## 2018-03-22 NOTE — Telephone Encounter (Signed)
New referral received from Dr. Silverio Decamp for the pt to have genetic counseling. Pt has been scheduled for the pt to see Ferol Luz on 7/16 at 10am. Pt aware to arrive 30 minutes early. Letter mailed.

## 2018-03-25 ENCOUNTER — Telehealth: Payer: Self-pay

## 2018-03-25 NOTE — Telephone Encounter (Signed)
  Follow up Call-  Call back number 03/22/2018 03/15/2017  Post procedure Call Back phone  # [phone]  Permission to leave phone message Yes Yes  Some recent data might be hidden     Patient questions:  Do you have a fever, pain , or abdominal swelling? No. Pain Score  0 *  Have you tolerated food without any problems? Yes.    Have you been able to return to your normal activities? Yes.    Do you have any questions about your discharge instructions: Diet   No. Medications  No. Follow up visit  No.  Do you have questions or concerns about your Care? No.  Actions: * If pain score is 4 or above: No action needed, pain <4.

## 2018-03-25 NOTE — Telephone Encounter (Signed)
  Follow up Call-  Call back number 03/22/2018 03/15/2017  Post procedure Call Back phone  # [phone]  Permission to leave phone message Yes Yes  Some recent data might be hidden     Attempted to leave message but voice mail has not yet been set up. Will try again later.

## 2018-03-27 ENCOUNTER — Encounter: Payer: Self-pay | Admitting: Gastroenterology

## 2018-05-07 ENCOUNTER — Other Ambulatory Visit: Payer: BC Managed Care – PPO

## 2018-05-07 ENCOUNTER — Encounter: Payer: BC Managed Care – PPO | Admitting: Genetics

## 2018-05-27 ENCOUNTER — Inpatient Hospital Stay (HOSPITAL_BASED_OUTPATIENT_CLINIC_OR_DEPARTMENT_OTHER): Payer: BC Managed Care – PPO | Admitting: Genetics

## 2018-05-27 ENCOUNTER — Inpatient Hospital Stay: Payer: BC Managed Care – PPO | Attending: Genetic Counselor

## 2018-05-27 ENCOUNTER — Encounter: Payer: Self-pay | Admitting: Genetics

## 2018-05-27 DIAGNOSIS — Z860100 Personal history of colon polyps, unspecified: Secondary | ICD-10-CM

## 2018-05-27 DIAGNOSIS — Z1379 Encounter for other screening for genetic and chromosomal anomalies: Secondary | ICD-10-CM

## 2018-05-27 DIAGNOSIS — Z803 Family history of malignant neoplasm of breast: Secondary | ICD-10-CM

## 2018-05-27 DIAGNOSIS — Z800 Family history of malignant neoplasm of digestive organs: Secondary | ICD-10-CM

## 2018-05-27 HISTORY — DX: Personal history of colon polyps, unspecified: Z86.0100

## 2018-05-27 HISTORY — DX: Personal history of colonic polyps: Z86.010

## 2018-05-27 NOTE — Progress Notes (Signed)
REFERRING PROVIDER: Mauri Pole, MD [place], [place] [id number]  PRIMARY PROVIDER:  Marda Stalker, PA-C  PRIMARY REASON FOR VISIT:  1. Family history of breast cancer   2. Family history of colon cancer   3. History of colonic polyps     HISTORY OF PRESENT ILLNESS:   [name], a 47 y.o. female, was seen for a [facility] cancer genetics consultation at the request of Dr. Bing Matter due to a personal history of colon polyps and family history of cancer.  [name] presents to clinic today to discuss the possibility of a hereditary predisposition to cancer, genetic testing, and to further clarify her future cancer risks, as well as potential cancer risks for family members.   [name] had a colonoscopy in 2018 due to blood in her stool that found 4 colon polyps. 1 was a large (67m+ tubular adenoma), 1 tubular adenoma, and 2 hyperplastic polyps.  Her most recent coloscopy in 2019 revealed 3 colon polyp,s (2 hyperplastic, 1 lymphoid polyp)  [name] has a history of a squamous cell carcinoma on her skin on her face.   HORMONAL RISK FACTORS:  Menarche was at age 47  First live birth at age 47  OCP use for approximately 0 years.  Ovaries intact: yes.  Hysterectomy: no.  Menopausal status: perimenopausal.  HRT use: 0 years. Colonoscopy: yes; see above. Mammogram within the last year: yes. Number of breast biopsies: 0.  Past Medical History:  Diagnosis Date  . Anxiety   . Family history of breast cancer   . GERD (gastroesophageal reflux disease)   . Heart murmur   . History of colonic polyps 05/27/2018  . Hyperlipidemia   . Hypertension   . Panic disorder     Past Surgical History:  Procedure Laterality Date  . COLONOSCOPY    . DILATION AND CURETTAGE OF UTERUS     for miscarriage  . POLYPECTOMY      Social History   Socioeconomic History  . Marital status: Married    Spouse name: Not on file  . Number of children: 2  . Years of  education: Not on file  . Highest education level: Not on file  Occupational History  . Occupation: tPharmacist, hospital  Social Needs  . Financial resource strain: Not on file  . Food insecurity:    Worry: Not on file    Inability: Not on file  . Transportation needs:    Medical: Not on file    Non-medical: Not on file  Tobacco Use  . Smoking status: Never Smoker  . Smokeless tobacco: Never Used  Substance and Sexual Activity  . Alcohol use: No  . Drug use: No  . Sexual activity: Not on file  Lifestyle  . Physical activity:    Days per week: Not on file    Minutes per session: Not on file  . Stress: Not on file  Relationships  . Social connections:    Talks on phone: Not on file    Gets together: Not on file    Attends religious service: Not on file    Active member of club or organization: Not on file    Attends meetings of clubs or organizations: Not on file    Relationship status: Not on file  Other Topics Concern  . Not on file  Social History Narrative  . Not on file     FAMILY HISTORY:  We obtained a detailed, 4-generation family history.  Significant diagnoses  are listed below: Family History  Problem Relation Age of Onset  . Hyperlipidemia Mother   . Hypertension Mother   . Heart attack Mother        May 2017, smoker  . GI Bleed Mother        Aug. 2017  . Diverticulitis Mother        Aug. 2017  . Irritable bowel syndrome Mother   . Hypertension Father   . Colon polyps Father   . Hypertension Sister   . Heart disease Maternal Grandmother   . Diabetes Mellitus I Maternal Grandmother   . Heart attack Paternal Grandfather   . Colon cancer Other        mat great aunt, dx in her 50's  . Breast cancer Maternal Aunt 74  . Lung cancer Maternal Aunt 59  . Cancer Sister 3       eye tumor/cancer- unk the name  . Other Sister        Brain tumors  . Esophageal cancer Neg Hx   . Stomach cancer Neg Hx   . Rectal cancer Neg Hx    Ms. Feimster has an 58 year-old son and  a 36 yer-old daughter with no history of cancer.  Ms. Narula has 2 sisters: -1 was dx with an eye tumor at 54, she had radiation and later developed several benign brain tumors (believed to be due to radiation treatment).  She died of strokes at 27.  -1 sister is 73 with no history of cancer.  She has 3 children.   Ms. Pickup's father: has had some colon polyps (3-4) he is 53 Paternal Aunts/Uncles: Ms. Schlick has 4 aunts/uncles who died as children in a house fire.  She has 1 paternal aunt and 1 paternal uncle who lived to adulthood.  The aunt had vaginal cancer.  Paternal cousins: no history of cancer Paternal grandfather: died of a heart attack in his 98's Paternal grandmother:died in the housefire  Ms. Macauley's mother: 40, no history of cancer.  She had a hysterectomy in her 30's due to a benign tumor- they left 1 ovary. She has declined genetic testing.  Maternal Aunts/Uncles: 7 maternal aunts/uncles.  1 maternal aunt was diagnosed with breast cancer at 45 and she later developed lung cancer in he 60's/70's.  She died in her 61's/70's.  Maternal cousins: no history of cancer.  Maternal grandfather: died in middle age, cause unk, hx of alcohol abuse Maternal grandmother:died of heart disease.  She had a brother who had colon cancer in his 64's.   Ms. Solivan is unaware of previous family history of genetic testing for hereditary cancer risks. Patient's maternal ancestors are of Caucasian descent, and paternal ancestors are of Caucasian descent. There is no reported Ashkenazi Jewish ancestry. There is no known consanguinity.  GENETIC COUNSELING ASSESSMENT: [name] is a 47 y.o. female with a personal and family history which is somewhat suggestive of a Hereditary Cancer Predisposition Syndrome. We, therefore, discussed and recommended the following at today's visit.   DISCUSSION: We reviewed the characteristics, features and inheritance patterns of hereditary cancer syndromes. We also  discussed genetic testing, including the appropriate family members to test, the process of testing, insurance coverage and turn-around-time for results. We discussed the implications of a negative, positive and/or variant of uncertain significant result. We recommended [name] pursue genetic testing for the Common Hereditary Cancers gene panel +RB1 + BAP1.   The Common Hereditary Cancer Panel offered by [facility] includes sequencing and/or deletion  duplication testing of the following 47 genes: APC, ATM, AXIN2, BARD1, BMPR1A, BRCA1, BRCA2, BRIP1, CDH1, CDKN2A (p14ARF), CDKN2A (p16INK4a), CKD4, CHEK2, CTNNA1, DICER1, EPCAM (Deletion/duplication testing only), GREM1 (promoter region deletion/duplication testing only), KIT, MEN1, MLH1, MSH2, MSH3, MSH6, MUTYH, NBN, NF1, NHTL1, PALB2, PDGFRA, PMS2, POLD1, POLE, PTEN, RAD50, RAD51C, RAD51D, SDHB, SDHC, SDHD, SMAD4, SMARCA4. STK11, TP53, TSC1, TSC2, and VHL.  The following genes were evaluated for sequence changes only: SDHA and HOXB13 c.251G>A variant only.  When an individual develops multiple adenomatous colon polyps, there is concern for a condition called Familial Adenomatous Polyposis (FAP).   In the classic form of FAP, people generally have hundreds to thousands of adenomatous polyps.  A milder version of FAP called Attenuated FAP (AFAP) is characterized by less than 100 adenomatous polyps.   There are two genes that have been associated with FAP/AFAP and each has a different pattern of inheritance.  The first gene is called APC and is inherited in an autosomal dominant fashion.  In this case, having only one alteration (mutation) in one copy of the APC gene causes polyps to develop.  In about 30% of cases caused by APC, the condition is diagnosed for the first time in a family where both parents do not have the condition.  In other words, there are 30% of people with FAP/AFAP where the mutation occurred in them for the first time.     Colon polyposis  can also be caused by mutations in the MUTYH gene, which causes a condition known as MUTYH-associated polyposis.  This is an autosomal recessive genetic condition.  In this case, an individual needs to have inherited a mutation in each copy of the MYH gene, one from each parent, in order to develop polyposis.  Carrying just one mutation of the MYH gene does not typically cause any problems or place an individual at risk for cancer.    Another syndrome we were concerned about in your family is called Lynch Syndrome, also called HNPCC (Hereditary Non-Polyposis Colon Cancer).  This syndrome increases the risk for colon, uterine, ovarian and stomach cancers, brain cancers, as well as others.  Families with Lynch Syndrome tend to have multiple family members with these cancers, typically diagnosed under age 44, and diagnoses in multiple generations. The genes that are known to cause Lynch Syndrome are called MLH1, MSH2, MSH6, PMS2 and EPCAM.     Some families that appear to have any of these conditions will have normal testing of these genes.  In those cases it is possible that the large amount of colon polyps may be causes by a syndrome called Serrated Polyposis Syndrome.  This condition causes an abnormally large amount of serrate polyps to develop and believe to be hereditary.  However, the genetic cause of this syndrome has not yet been identified.     We also discussed the BRCA1 and BRCA2 genes due to her family history of breast cancer at a young age in her maternal aunt.    We discussed that if she is found to have a mutation in one of these genes, it may impact future medical management recommendations such as increased cancer screenings and consideration of risk reducing surgeries.  A positive result could also have implications for the patient's family members.   A Negative result would mean we were unable to identify a hereditary component to her development of adenomatous polyps, but does not rule  out the possibility of a hereditary basis for her polyps or family history of  cancer. There could be mutations that are undetectable by current technology, or in genes not yet tested or identified to increase cancer risk.     We discussed the potential to find a Variant of Uncertain Significance or VUS.  These are variants that have not yet been identified as pathogenic or benign, and it is unknown if this variant is associated with increased cancer risk or if this is a normal finding.  Most VUS's are reclassified to benign or likely benign.   It should not be used to make medical management decisions. With time, we suspect the lab will determine the significance of any VUS's identified if any.   Based on [name]'s family history of cancer, she meets NCCN medical criteria for genetic testing. Despite that she meets criteria, she may still have an out of pocket cost. The laboratory can provide her with an estimate of her OOP cost.   We discussed that some people do not want to undergo genetic testing due to fear of genetic discrimination.  A federal law called the Genetic Information Non-Discrimination Act (GINA) of 2008 helps protect individuals against genetic discrimination based on their genetic test results.  It impacts both health insurance and employment.  For health insurance, it protects against increased premiums, being kicked off insurance or being forced to take a test in order to be insured.  For employment it protects against hiring, firing and promoting decisions based on genetic test results.  Health status due to a cancer diagnosis is not protected under GINA.  This law does not protect life insurance, disability insurance, or other types of insurance.   PLAN: After considering the risks, benefits, and limitations, [name]  provided informed consent to pursue genetic testing and the blood sample was sent to [facility] for analysis of the Common Hereditary Cancers Panel + BAP1  + RB1. Results should be available within approximately 2-3 weeks' time, at which point they will be disclosed by telephone to [name], as will any additional recommendations warranted by these results. [name] will receive a summary of her genetic counseling visit and a copy of her results once available. This information will also be available in Epic. We encouraged [name] to remain in contact with cancer genetics annually so that we can continuously update the family history and inform her of any changes in cancer genetics and testing that may be of benefit for her family. [name] questions were answered to her satisfaction today. Our contact information was provided should additional questions or concerns arise.  Based on [name] family history, we recommended her mother/maternal relatives also have genetic testing. She repots her mother is not interested in testing and has declined.  [name] will let us know if we can be of any assistance in coordinating genetic counseling and/or testing for this family member.   Lastly, we encouraged [name] to remain in contact with cancer genetics annually so that we can continuously update the family history and inform her of any changes in cancer genetics and testing that may be of benefit for this family.   Ms.  Buchinger's questions were answered to her satisfaction today. Our contact information was provided should additional questions or concerns arise. Thank you for the referral and allowing [place] to share in the care of your patient.   Tana Felts, MS, [facility] Certified Genetic Counselor [email or web address] phone: [phone]  The patient was seen for a total of 40 minutes in face-to-face genetic counseling.  This patient was dicussed with Dr.'s Karen Kitchens, or [place] who agrees with the above.

## 2018-05-28 ENCOUNTER — Encounter: Payer: Self-pay | Admitting: Genetics

## 2018-06-11 ENCOUNTER — Telehealth: Payer: Self-pay | Admitting: Genetics

## 2018-06-12 ENCOUNTER — Encounter: Payer: Self-pay | Admitting: Genetics

## 2018-06-12 ENCOUNTER — Ambulatory Visit: Payer: Self-pay | Admitting: Genetics

## 2018-06-12 DIAGNOSIS — Z1379 Encounter for other screening for genetic and chromosomal anomalies: Secondary | ICD-10-CM

## 2018-06-12 DIAGNOSIS — Z803 Family history of malignant neoplasm of breast: Secondary | ICD-10-CM

## 2018-06-12 DIAGNOSIS — Z86010 Personal history of colonic polyps: Secondary | ICD-10-CM

## 2018-06-12 DIAGNOSIS — Z800 Family history of malignant neoplasm of digestive organs: Secondary | ICD-10-CM

## 2018-06-12 NOTE — Progress Notes (Signed)
HPI:  Ms. Jester was previously seen in the [place] clinic on 05/27/2018 due to a personal history of colon polyps and family history of breast cancer and concerns regarding a hereditary predisposition to cancer. Please refer to our prior cancer genetics clinic note for more information regarding Ms. Ramseyer's medical, social and family histories, and our assessment and recommendations, at the time. Ms. Stalling's recent genetic test results were disclosed to her, as well as recommendations warranted by these results. These results and recommendations are discussed in more detail below.  Ms. Tones had a colonoscopy in 2018 due to blood in her stool that found 4 colon polyps. 1 was a large (29m+ tubular adenoma), 1 tubular adenoma, and 2 hyperplastic polyps.  Her most recent coloscopy in 2019 revealed 3 colon polyp,s (2 hyperplastic, 1 lymphoid polyp)  Ms. Margolis has a history of a squamous cell carcinoma on her skin on her face.    FAMILY HISTORY:  We obtained a detailed, 4-generation family history.  Significant diagnoses are listed below: Family History  Problem Relation Age of Onset  . Hyperlipidemia Mother   . Hypertension Mother   . Heart attack Mother        May 2017, smoker  . GI Bleed Mother        Aug. 2017  . Diverticulitis Mother        Aug. 2017  . Irritable bowel syndrome Mother   . Hypertension Father   . Colon polyps Father   . Hypertension Sister   . Heart disease Maternal Grandmother   . Diabetes Mellitus I Maternal Grandmother   . Heart attack Paternal Grandfather   . Colon cancer Other        mat great uncle, dx in 83's . Breast cancer Maternal Aunt 484 . Lung cancer Maternal Aunt 636 . Cancer Sister 3       eye tumor/cancer- unk the name  . Other Sister        Brain tumors  . Esophageal cancer Neg Hx   . Stomach cancer Neg Hx   . Rectal cancer Neg Hx    Ms. CTitzerhas an 186year-old son and a 13yer-old daughter with no history of cancer.   Ms. CPequignothas 2 sisters: -1 was dx with an eye tumor at 335 she had radiation and later developed several benign brain tumors (believed to be due to radiation treatment).  She died of strokes at 339  -1 sister is 47with no history of cancer.  She has 3 children.   Ms. Udall's father: has had some colon polyps (3-4) he is 47Paternal Aunts/Uncles: Ms. CAlmariohas 4 aunts/uncles who died as children in a house fire.  She has 1 paternal aunt and 1 paternal uncle who lived to adulthood.  The aunt had vaginal cancer.  Paternal cousins: no history of cancer Paternal grandfather: died of a heart attack in his 762'sPaternal grandmother:died in the housefire  Ms. Prigmore's mother: 47 no history of cancer.  She had a hysterectomy in her 30's due to a benign tumor- they left 1 ovary. She has declined genetic testing.  Maternal Aunts/Uncles: 7 maternal aunts/uncles.  1 maternal aunt was diagnosed with breast cancer at 45and she later developed lung cancer in he 47.  She died in her 47's/70's  Maternal cousins: no history of cancer.  Maternal grandfather: died in middle age, cause unk, hx of alcohol abuse Maternal grandmother:died of heart disease.  She had a brother who had colon cancer in his 40's.   Ms. Conger is unaware of previous family history of genetic testing for hereditary cancer risks. Patient's maternal ancestors are of Caucasian descent, and paternal ancestors are of Caucasian descent. There is no reported Ashkenazi Jewish ancestry. There is no known consanguinity.  GENETIC TEST RESULTS: Genetic testing performed through [facility]'s Common Hereditary Cancers Panel + Colon Prelim evidence genes reported out on 06/10/2018 showed no pathogenic mutations. The Common Hereditary Cancers Panel + Colorectal prelim evidence genes were ordered (100 genes):  APC, ATM, AXIN2, BARD1, BMPR1A, BRCA1, BRCA2,BLM< BUBe1B,  BRIP1, CDH1, CDKN2A (p14ARF), CDKN2A (p16INK4a), CEP57, ENG, CKD4, CHEK2, CTNNA1,  DICER1, EPCAM (Deletion/duplication testing only), FLCN, GALNT12, GREM1 (promoter region deletion/duplication testing only), KIT, MEN1, MLH1, MLH3, MSH2, MSH3, MSH6, MUTYH, NBN, NF1, NHTL1, PALB2, PDGFRA, PMS2, POLD1, POLE, PTEN, RNF43, RSP20, RAD50, RAD51C, RAD51D, SDHB, SDHC, SDHD, SMAD4, SMARCA4. STK11, TP53, TSC1, TSC2, and VHL.  The following genes were evaluated for sequence changes only: SDHA and HOXB13 c.251G>A variant only.  The test report will be scanned into EPIC and will be located under the Molecular Pathology section of the Results Review tab. A portion of the result report is included below for reference.     We discussed with Ms. Creppel that because current genetic testing is not perfect, it is possible there may be a gene mutation in one of these genes that current testing cannot detect, but that chance is small.  We also discussed, that there could be another gene that has not yet been discovered, or that we have not yet tested, that is responsible for the cancer diagnoses in the family. It is also possible there is a hereditary cause for the cancer in the family that Ms. Ligas did not inherit and therefore was not identified in her testing.  Therefore, it is important to remain in touch with cancer genetics in the future so that we can continue to offer Ms. Billy the most up to date genetic testing.   ADDITIONAL GENETIC TESTING: We discussed with Ms. Whitesel that there are other genes that are associated with increased cancer risk that can be analyzed. The laboratories that offer this testing look at these additional genes via a hereditary cancer gene panel. Should Ms. Teat wish to pursue additional genetic testing, we are happy to discuss and coordinate this testing, at any time.    CANCER SCREENING RECOMMENDATIONS: This negative result means that we were unable to identify a hereditary cause for her personal and family history of colon polyps/cancer at this time.  This result does  not rule out a hereditary cause for her  polyps.  It is still possible that there could be genetic mutations that are undetectable by current technology, or genetic mutations in genes that have not been tested or identified to increase cancer risk.  Therefore, it is recommended she continue to follow the cancer management and screening guidelines provided by her oncology and primary healthcare provider. An individual's cancer risk is not determined by genetic test results alone.  Overall cancer risk assessment includes additional factors such as personal medical history, family history, etc.  These should be used to make a personalized plan for cancer prevention and surveillance.    Based on the Ms. Hagger's personal and family history of cancer, as well as her genetic test results, the statistical model Tobey Bride Cusik was used to estimate her risk of developing breast cancer. This estimates her lifetime risk of developing breast cancer  to be approximately 11%. The ACS recommends consideration of breast MRI screening as an adjunct to mammography for patients at high risk (defined as 20% or greater lifetime risk). A more detailed breast cancer risk assessment can be considered, if clinically indicated.   RECOMMENDATIONS FOR FAMILY MEMBERS:  Relatives in this family might be at some increased risk of developing cancer, over the general population risk, simply due to the family history of cancer.  We recommended women in this family have a yearly mammogram beginning at age 90, or 61 years younger than the earliest onset of cancer, an annual clinical breast exam, and perform monthly breast self-exams. Women in this family should also have a gynecological exam as recommended by their primary provider. All family members should have a colonoscopy by age 68 (or as directed by their doctors).  All family members should inform their physicians about the family history of cancer so their doctors can make the most  appropriate screening recommendations for them.   It is also possible there is a hereditary cause for the cancer in Ms. Kretzschmar's family that she did not inherit and therefore was not identified in her.   Therefore, we recommended her mother/maternal relatives also have genetic counseling and testing. Ms. Haliburton will let us know if we can be of any assistance in coordinating genetic counseling and/or testing for these family members.   FOLLOW-UP: Lastly, we discussed with Ms. Dykema that cancer genetics is a rapidly advancing field and it is possible that new genetic tests will be appropriate for her and/or her family members in the future. We encouraged her to remain in contact with cancer genetics on an annual basis so we can update her personal and family histories and let her know of advances in cancer genetics that may benefit this family.   Our contact number was provided. Ms. Hsu's questions were answered to her satisfaction, and she knows she is welcome to call us at anytime with additional questions or concerns.   Ferol Luz, MS, [facility] Certified Genetic Counselor [email or web address]

## 2018-06-12 NOTE — Telephone Encounter (Signed)
Revealed negative genetic testing.   This normal result is reassuring and indicates we did not find a hereditary predisposition to cancer in her or a genetic cause for her colon polyps.   However, genetic testing is not perfect, and cannot definitively rule out a hereditary cause.  It will be important for her to keep in contact with genetics to learn if any additional testing may be needed in the future.     Reccommended she continue following her doctors' recommendations regarding colonoscopies and other caner screening- she will still need additional screening due to her personal history of colon poloys.   Recommended her maternal relatives also have genetic testing due to the family history of breast cancer.  Recommended telling her family about her polyps so their doctors can make the best recommendations for them based on this history.

## 2018-07-12 ENCOUNTER — Encounter (HOSPITAL_COMMUNITY): Payer: Self-pay

## 2018-07-12 ENCOUNTER — Emergency Department (HOSPITAL_COMMUNITY): Payer: BC Managed Care – PPO

## 2018-07-12 ENCOUNTER — Emergency Department (HOSPITAL_COMMUNITY)
Admission: EM | Admit: 2018-07-12 | Discharge: 2018-07-12 | Disposition: A | Discharge status: Home / Self Care | Payer: BC Managed Care – PPO | Attending: Emergency Medicine | Admitting: Emergency Medicine

## 2018-07-12 ENCOUNTER — Other Ambulatory Visit: Payer: Self-pay

## 2018-07-12 DIAGNOSIS — R0789 Other chest pain: Secondary | ICD-10-CM

## 2018-07-12 DIAGNOSIS — I10 Essential (primary) hypertension: Secondary | ICD-10-CM | POA: Insufficient documentation

## 2018-07-12 DIAGNOSIS — R079 Chest pain, unspecified: Secondary | ICD-10-CM | POA: Diagnosis present

## 2018-07-12 DIAGNOSIS — E876 Hypokalemia: Secondary | ICD-10-CM | POA: Insufficient documentation

## 2018-07-12 DIAGNOSIS — Z79899 Other long term (current) drug therapy: Secondary | ICD-10-CM | POA: Diagnosis not present

## 2018-07-12 LAB — CBC
HCT: 39.6 % (ref 36.0–46.0)
Hemoglobin: 13.3 g/dL (ref 12.0–15.0)
MCH: 28.7 pg (ref 26.0–34.0)
MCHC: 33.6 g/dL (ref 30.0–36.0)
MCV: 85.3 fL (ref 78.0–100.0)
PLATELETS: 184 10*3/uL (ref 150–400)
RBC: 4.64 MIL/uL (ref 3.87–5.11)
RDW: 12.5 % (ref 11.5–15.5)
WBC: 7.4 10*3/uL (ref 4.0–10.5)

## 2018-07-12 LAB — BASIC METABOLIC PANEL
Anion gap: 12 (ref 5–15)
BUN: 18 mg/dL (ref 6–20)
CALCIUM: 10.1 mg/dL (ref 8.9–10.3)
CHLORIDE: 100 mmol/L (ref 98–111)
CO2: 25 mmol/L (ref 22–32)
CREATININE: 1.05 mg/dL — AB (ref 0.44–1.00)
GFR calc non Af Amer: >60 mL/min (ref >60)
Glucose, Bld: 105 mg/dL — ABNORMAL HIGH (ref 70–99)
Potassium: 3.1 mmol/L — ABNORMAL LOW (ref 3.5–5.1)
SODIUM: 137 mmol/L (ref 135–145)

## 2018-07-12 LAB — I-STAT TROPONIN, ED
TROPONIN I, POC: 0 ng/mL (ref 0.00–0.08)
TROPONIN I, POC: 0.02 ng/mL (ref 0.00–0.08)

## 2018-07-12 MED ORDER — POTASSIUM CHLORIDE CRYS ER 20 MEQ PO TBCR
20.0000 meq | EXTENDED_RELEASE_TABLET | Freq: Once | ORAL | Status: AC
  Administered 2018-07-12: 20 meq via ORAL
  Filled 2018-07-12: qty 1

## 2018-07-12 NOTE — ED Provider Notes (Signed)
[facility] EMERGENCY DEPARTMENT Provider Note   CSN: [id number] Arrival date & time: 07/12/18  1408     History   Chief Complaint Chief Complaint  Patient presents with  . Chest Pain    HPI RHENA GLACE is a 47 y.o. female.  The history is provided by the patient. No language interpreter was used.  Chest Pain     Sheyla E Kassner is a 47 y.o. female who presents to the Emergency Department complaining of chest pain. She presents to the emergency department complaining of migratory chest pain that is been ongoing for the last four days. Pain comes and goes in is in all areas of her chest and back. She denies any fevers, cough, shortness of breath, abdominal pain, nausea, vomiting, leg swelling or pain. She is able to perform regular activities without any difficulty. She does have a history of anxiety. She had similar symptoms when she was evaluated in the emergency department in January and told she had indigestion. She has been taking Prilosec since that time and initially her symptoms resolved only to recur a few days ago. She has a history of hypertension and hyperlipidemia. She is a non-smoker. No personal family history of DVT. No recent surgeries. She does have a family history of cardiac disease and her mother in the age of 57. She does not take any hormone medications. Past Medical History:  Diagnosis Date  . Anxiety   . Family history of breast cancer   . GERD (gastroesophageal reflux disease)   . Heart murmur   . History of colonic polyps 05/27/2018  . Hyperlipidemia   . Hypertension   . Panic disorder     Patient Active Problem List   Diagnosis Date Noted  . Genetic testing 06/12/2018  . History of colonic polyps 05/27/2018  . Family history of breast cancer   . Family history of colon cancer   . Hyperlipidemia with target LDL less than 100 01/12/2017  . Morbid obesity ([place]) 01/12/2017  . Atypical chest pain 01/12/2017  . Heart murmur,  systolic 29/56/2130  . Palpitations 01/11/2017    Past Surgical History:  Procedure Laterality Date  . COLONOSCOPY    . DILATION AND CURETTAGE OF UTERUS     for miscarriage  . POLYPECTOMY       OB History   None      Home Medications    Prior to Admission medications   Medication Sig Start Date End Date Taking? Authorizing Provider  losartan-hydrochlorothiazide (HYZAAR) 50-12.5 MG tablet Take 1 tablet by mouth daily.    [provider]  omeprazole (PRILOSEC) 20 MG capsule Take 1 capsule (20 mg total) by mouth daily. Patient taking differently: Take 20 mg by mouth every other day.  11/01/17   Palumbo, April, MD  PARoxetine (PAXIL) 10 MG tablet Take 10 mg by mouth daily.    [provider]  pravastatin (PRAVACHOL) 40 MG tablet Take 1 tablet by mouth daily. 01/15/17   [provider]    Family History Family History  Problem Relation Age of Onset  . Hyperlipidemia Mother   . Hypertension Mother   . Heart attack Mother        May 2017, smoker  . GI Bleed Mother        Aug. 2017  . Diverticulitis Mother        Aug. 2017  . Irritable bowel syndrome Mother   . Hypertension Father   . Colon polyps Father   .  Hypertension Sister   . Heart disease Maternal Grandmother   . Diabetes Mellitus I Maternal Grandmother   . Heart attack Paternal Grandfather   . Colon cancer Other        mat great uncle, dx in 42's  . Breast cancer Maternal Aunt 3  . Lung cancer Maternal Aunt 50  . Cancer Sister 3       eye tumor/cancer- unk the name  . Other Sister        Brain tumors  . Esophageal cancer Neg Hx   . Stomach cancer Neg Hx   . Rectal cancer Neg Hx     Social History Social History   Tobacco Use  . Smoking status: Never Smoker  . Smokeless tobacco: Never Used  Substance Use Topics  . Alcohol use: No  . Drug use: No     Allergies   Ciprofloxacin   Review of Systems Review of Systems  Cardiovascular: Positive for chest pain.  All other  systems reviewed and are negative.    Physical Exam Updated Vital Signs BP (!) 129/95   Pulse 88   Temp 98.4 F (36.9 C) (Oral)   Resp 13   Ht 5\' 7"  (1.702 m)   Wt 109.8 kg   SpO2 97%   BMI 37.90 kg/m   Physical Exam  Constitutional: She is oriented to person, place, and time. She appears well-developed and well-nourished.  HENT:  Head: Normocephalic and atraumatic.  Cardiovascular: Normal rate and regular rhythm.  Murmur heard. Pulmonary/Chest: Effort normal and breath sounds normal. No respiratory distress.  Abdominal: Soft. There is no tenderness. There is no rebound and no guarding.  Musculoskeletal: She exhibits no edema or tenderness.  Neurological: She is alert and oriented to person, place, and time.  Skin: Skin is warm and dry.  Psychiatric: She has a normal mood and affect. Her behavior is normal.  Nursing note and vitals reviewed.    ED Treatments / Results  Labs (all labs ordered are listed, but only abnormal results are displayed) Labs Reviewed  BASIC METABOLIC PANEL - Abnormal; Notable for the following components:      Result Value   Potassium 3.1 (*)    Glucose, Bld 105 (*)    Creatinine, Ser 1.05 (*)    All other components within normal limits  CBC  I-STAT TROPONIN, ED  I-STAT TROPONIN, ED    EKG EKG Interpretation  Date/Time:  Friday July 12 2018 14:14:58 EDT Ventricular Rate:  104 PR Interval:  148 QRS Duration: 90 QT Interval:  354 QTC Calculation: 465 R Axis:   55 Text Interpretation:  Sinus tachycardia Otherwise normal ECG Confirmed by Quintella Reichert [phone]) on 07/12/2018 7:36:00 PM   Radiology Dg Chest 2 View  Result Date: 07/12/2018 CLINICAL DATA:  Chest pain EXAM: CHEST - 2 VIEW COMPARISON:  10/31/2017 FINDINGS: The heart size and mediastinal contours are within normal limits. Both lungs are clear. The visualized skeletal structures are unremarkable. IMPRESSION: No active cardiopulmonary disease. Electronically Signed    By: Donavan Foil M.D.   On: 07/12/2018 15:10    Procedures Procedures (including critical care time)  Medications Ordered in ED Medications  potassium chloride SA (K-DUR,KLOR-CON) CR tablet 20 mEq (has no administration in time range)     Initial Impression / Assessment and Plan / ED Course  I have reviewed the triage vital signs and the nursing notes.  Pertinent labs & imaging results that were available during my care of the patient were reviewed by  me and considered in my medical decision making (see chart for details).     Patient here for evaluation of one week of migratory chest pain. She is alert non-toxic appearing on examination. Current presentation is not consistent with ACS, PE, dissection, CHF, pneumonia, cholecystitis. Counseled patient on home care for indigestion, possible gas. Discussed importance of outpatient follow-up as well as return precautions.  Final Clinical Impressions(s) / ED Diagnoses   Final diagnoses:  Atypical chest pain  Hypokalemia    ED Discharge Orders    None       Quintella Reichert, MD 07/12/18 2030

## 2018-07-12 NOTE — ED Notes (Signed)
Patient verbalizes understanding of discharge instructions. Opportunity for questioning and answers were provided. Armband removed by staff, pt discharged from ED ambulatory.   

## 2018-07-12 NOTE — ED Triage Notes (Signed)
Pt states that she has been having intermittent chest pain since Tuesday. Pt has history of same, was given Prilosec to take. States the pain moves from central chest, to shoulder to back.

## 2018-09-26 ENCOUNTER — Other Ambulatory Visit: Payer: Self-pay | Admitting: Family Medicine

## 2018-09-26 DIAGNOSIS — Z1231 Encounter for screening mammogram for malignant neoplasm of breast: Secondary | ICD-10-CM

## 2018-11-04 ENCOUNTER — Ambulatory Visit
Admission: RE | Admit: 2018-11-04 | Discharge: 2018-11-04 | Disposition: A | Discharge status: Home / Self Care | Payer: BC Managed Care – PPO | Source: Ambulatory Visit | Attending: Family Medicine | Admitting: Family Medicine

## 2018-11-04 DIAGNOSIS — Z1231 Encounter for screening mammogram for malignant neoplasm of breast: Secondary | ICD-10-CM

## 2019-01-16 ENCOUNTER — Telehealth: Payer: Self-pay | Admitting: Interventional Cardiology

## 2019-01-16 NOTE — Telephone Encounter (Signed)
C/o CP, left shoulder to mid chest x 1 week that is worsening.  Described as burning, achy pain,  pain ranges to 5/10 at worst. Been seeing pcp because having her paxil transitioned to zoloft. and Dr increased her Nexium 20mg  daily to bid.  States she is belching and gassy,  States these SX are like her last 2 visits to ED.  Denies exertional pain, vomiting and sweating. has nausea occ.  Spoke with Dr Burt Knack /DOD Atypical cp, f/u w PCP, may try elevating HOB, add OTC Pepcid, bland diet. Pt was appreciative of information and willing to try suggestions and will refer back to PCP.

## 2019-01-16 NOTE — Telephone Encounter (Signed)
Pt c/o of Chest Pain: STAT if CP now or developed within 24 hours  1. Are you having CP right now? Yes a little-It  Feels like a burning and indigestion, sometimes it goes through her upper back  2. Are you experiencing any other symptoms (ex. SOB, nausea, vomiting, sweating)? Feel nauseated  sometimes  3. How long have you been experiencing CP?  A week  4. Is your CP continuous or coming and going? Comes and go  5. Have you taken Nitroglycerin? No- pt wants to be seen ?

## 2019-08-13 NOTE — Progress Notes (Deleted)
CARDIOLOGY OFFICE NOTE  Date:  08/13/2019    [name] Date of Birth: 08-Jul-1971 Medical Record [id number]  PCP:  Marda Stalker, PA-C  Cardiologist:  Servando Snare & ***    No chief complaint on file.   History of Present Illness: [name] is a 48 y.o. female who presents today for a *** being seen for palpitation and systolic murmur.   Referred because of a soft systolic murmur. Also some complaint of palpitations. She has no real cardiac complaints. She is referred by her primary care provider because of murmur and irregular heartbeat. She has no real cardiac complaints.She does have some dyspnea on exertion. She has gained weight. She is physically inactive. Positive family history of CAD, mother underwent LAD stenting by Dr. Burt Knack recently at age 105.  The patient {does/does not:200015} have symptoms concerning for COVID-19 infection (fever, chills, cough, or new shortness of breath).   Comes in today. Here with   Past Medical History:  Diagnosis Date  . Anxiety   . Family history of breast cancer   . GERD (gastroesophageal reflux disease)   . Heart murmur   . History of colonic polyps 05/27/2018  . Hyperlipidemia   . Hypertension   . Panic disorder     Past Surgical History:  Procedure Laterality Date  . COLONOSCOPY    . DILATION AND CURETTAGE OF UTERUS     for miscarriage  . POLYPECTOMY       Medications: No outpatient medications have been marked as taking for the 08/20/19 encounter (Appointment) with Burtis Junes, NP.   Current Facility-Administered Medications for the 08/20/19 encounter (Appointment) with Burtis Junes, NP  Medication  . 0.9 %  sodium chloride infusion  . 0.9 %  sodium chloride infusion     Allergies: Allergies  Allergen Reactions  . Ciprofloxacin Rash    Social History: The patient  reports that she has never smoked. She has never used smokeless tobacco. She reports that she does not drink  alcohol or use drugs.   Family History: The patient's ***family history includes Breast cancer (age of onset: 67) in her maternal aunt; Cancer (age of onset: 54) in her sister; Colon cancer in an other family member; Colon polyps in her father; Diabetes Mellitus I in her maternal grandmother; Diverticulitis in her mother; GI Bleed in her mother; Heart attack in her mother and paternal grandfather; Heart disease in her maternal grandmother; Hyperlipidemia in her mother; Hypertension in her father, mother, and sister; Irritable bowel syndrome in her mother; Lung cancer (age of onset: 80) in her maternal aunt; Other in her sister.   Review of Systems: Please see the history of present illness.   All other systems are reviewed and negative.   Physical Exam: VS:  There were no vitals taken for this visit. Marland Kitchen  BMI There is no height or weight on file to calculate BMI.  Wt Readings from Last 3 Encounters:  07/12/18 242 lb (109.8 kg)  03/22/18 249 lb (112.9 kg)  03/08/18 249 lb 6.4 oz (113.1 kg)    General: Pleasant. Well developed, well nourished and in no acute distress.   HEENT: Normal.  Neck: Supple, no JVD, carotid bruits, or masses noted.  Cardiac: ***Regular rate and rhythm. No murmurs, rubs, or gallops. No edema.  Respiratory:  Lungs are clear to auscultation bilaterally with normal work of breathing.  GI: Soft and nontender.  MS: No deformity or atrophy. Gait and ROM intact.  Skin:  Warm and dry. Color is normal.  Neuro:  Strength and sensation are intact and no gross focal deficits noted.  Psych: Alert, appropriate and with normal affect.   LABORATORY DATA:  EKG:  EKG {ACTION; IS/IS [id number] ordered today. This demonstrates ***.  Lab Results  Component Value Date   WBC 7.4 07/12/2018   HGB 13.3 07/12/2018   HCT 39.6 07/12/2018   PLT 184 07/12/2018   GLUCOSE 105 (H) 07/12/2018   NA 137 07/12/2018   K 3.1 (L) 07/12/2018   CL 100 07/12/2018   CREATININE 1.05 (H) 07/12/2018    BUN 18 07/12/2018   CO2 25 07/12/2018     BNP (last 3 results) No results for input(s): BNP in the last 8760 hours.  ProBNP (last 3 results) No results for input(s): PROBNP in the last 8760 hours.   Other Studies Reviewed Today:   Assessment/Plan: 1. Heart murmur, systolic   2. Atypical chest pain   3. Hyperlipidemia with target LDL less than 100   4. Palpitations   5. Morbid obesity ([place])      PLAN:  In order of problems listed above:  1. Based upon auscultation, the murmur represents a physiologic flow murmur. No evaluation is needed. 2. Chest discomfort is atypical, nonexertional, and lasts seconds before resolving. Probably musculoskeletal. 3. Severe elevation in lipids with total cholesterol in September 2017 of 280 and LDL cholesterol 188 with HDL of 64. Given family history of CAD and other risk factor of obesity, would recommend serious attempts that lipid lowering starting with statin therapy. LDL target should be at least 100. We'll refer this back to primary care. I counseled the patient concerning increasing plan based products in her diet, decreasing carbohydrates, and engaging in regular aerobic exercise. 4. Sounds like she is having premature beats that are not clinically bothersome and required no workup. 5. Please see and I'll all come to #3. Also note that with her history of snoring, sleep apnea is a consideration if she develops progressive palpitations, tachycardia, excessive daytime sleepiness, etc.     . COVID-19 Education: The signs and symptoms of COVID-19 were discussed with the patient and how to seek care for testing (follow up with PCP or arrange E-visit).  The importance of social distancing, staying at home, hand hygiene and wearing a mask when out in public were discussed today.  Current medicines are reviewed with the patient today.  The patient does not have concerns regarding medicines other than what has been noted above.  The  following changes have been made:  See above.  Labs/ tests ordered today include:   No orders of the defined types were placed in this encounter.    Disposition:   FU with *** in {gen number [id number] {Days to years:10300}.   Patient is agreeable to this plan and will call if any problems develop in the interim.   SignedTruitt Merle, NP  08/13/2019 7:04 AM  [facility] [address] [place], [place]  [address] Phone: [phone] Fax: [phone]

## 2019-08-19 ENCOUNTER — Ambulatory Visit: Payer: BC Managed Care – PPO | Admitting: Nurse Practitioner

## 2019-08-20 ENCOUNTER — Ambulatory Visit: Payer: BC Managed Care – PPO | Admitting: Nurse Practitioner

## 2019-08-25 ENCOUNTER — Other Ambulatory Visit: Payer: Self-pay

## 2019-08-25 DIAGNOSIS — Z20822 Contact with and (suspected) exposure to covid-19: Secondary | ICD-10-CM

## 2019-08-26 LAB — NOVEL CORONAVIRUS, NAA: SARS-CoV-2, NAA: NOT DETECTED

## 2019-09-28 NOTE — Progress Notes (Signed)
Cardiology Office Note:    Date:  09/29/2019   ID:  [name], DOB [birth year], MRN [id number]  PCP:  Marda Stalker, PA-C  Cardiologist:  Sinclair Grooms, MD   Referring MD: Marda Stalker, PA-C   Chief Complaint  Patient presents with  . Hyperlipidemia    History of Present Illness:    [name] is a 48 y.o. female with a hx of palpitation, hyperlipidemia, morbid obesity, and systolic murmur.  In March 2020 the patient called with the following complaints by telephone: C/o CP, left shoulder to mid chest x 1 week that is worsening. Described as burning, achy pain.and ranges to 5/10 at worst. Been seeing pcp because having her paxil transitioned to zoloft. and Dr increased her Nexium 20mg  daily to bid.  States she is belching and gassy,  States these SX are like her last 2 visits to ED.   pain ranges to 5/10 at worst.  Halo is concerned that she has recurring discomfort in her chest.  There is relatively localized midsternal discomfort that can radiate to the left shoulder and into her back.  Episodes tend to occur spontaneously.  The discomfort can wax and wane.  The discomfort is not precipitated by physical activity.  She has had multiple episodes of the discomfort over the past 2 years.  There is no associated dyspnea.  She walks on a regular basis and the discomfort is not precipitated by activity.  Perhaps symptoms are improved if she doubles up on her protein pump inhibitor.  She is concerned that a specific diagnosis has not been made.  Her mother had a myocardial infarction at age 84, maternal grandmother had myocardial infarction and paternal grandfather did as well.  Her father has hypertension.  She has never smoked.  Past Medical History:  Diagnosis Date  . Anxiety   . Family history of breast cancer   . GERD (gastroesophageal reflux disease)   . Heart murmur   . History of colonic polyps 05/27/2018  . Hyperlipidemia   . Hypertension    . Panic disorder     Past Surgical History:  Procedure Laterality Date  . COLONOSCOPY    . DILATION AND CURETTAGE OF UTERUS     for miscarriage  . POLYPECTOMY      Current Medications: Current Meds  Medication Sig  . busPIRone (BUSPAR) 10 MG tablet Take 20 mg by mouth 2 (two) times daily.  . clonazePAM (KLONOPIN) 0.5 MG tablet Take 0.5 mg by mouth daily as needed.  Marland Kitchen escitalopram (LEXAPRO) 20 MG tablet Take 30 mg by mouth daily.  Marland Kitchen losartan-hydrochlorothiazide (HYZAAR) 50-12.5 MG tablet Take 1 tablet by mouth daily.  . pravastatin (PRAVACHOL) 40 MG tablet Take 1 tablet by mouth daily.   Current Facility-Administered Medications for the 09/29/19 encounter (Office Visit) with Belva Crome, MD  Medication  . 0.9 %  sodium chloride infusion  . 0.9 %  sodium chloride infusion     Allergies:   Ciprofloxacin   Social History   Socioeconomic History  . Marital status: Married    Spouse name: Not on file  . Number of children: 2  . Years of education: Not on file  . Highest education level: Not on file  Occupational History  . Occupation: Pharmacist, hospital   Social Needs  . Financial resource strain: Not on file  . Food insecurity    Worry: Not on file    Inability: Not on file  . Transportation needs  Medical: Not on file    Non-medical: Not on file  Tobacco Use  . Smoking status: Never Smoker  . Smokeless tobacco: Never Used  Substance and Sexual Activity  . Alcohol use: No  . Drug use: No  . Sexual activity: Not on file  Lifestyle  . Physical activity    Days per week: Not on file    Minutes per session: Not on file  . Stress: Not on file  Relationships  . Social Herbalist on phone: Not on file    Gets together: Not on file    Attends religious service: Not on file    Active member of club or organization: Not on file    Attends meetings of clubs or organizations: Not on file    Relationship status: Not on file  Other Topics Concern  . Not on file   Social History Narrative  . Not on file     Family History: The patient's family history includes Breast cancer (age of onset: 59) in her maternal aunt; Cancer (age of onset: 74) in her sister; Colon cancer in an other family member; Colon polyps in her father; Diabetes Mellitus I in her maternal grandmother; Diverticulitis in her mother; GI Bleed in her mother; Heart attack in her mother and paternal grandfather; Heart disease in her maternal grandmother; Hyperlipidemia in her mother; Hypertension in her father, mother, and sister; Irritable bowel syndrome in her mother; Lung cancer (age of onset: 37) in her maternal aunt; Other in her sister. There is no history of Esophageal cancer, Stomach cancer, or Rectal cancer.  ROS:   Please see the history of present illness.    She has been clinically diagnosed as having reflux.  No definitive diagnosis has been made.  She is on H2 and proton pump inhibitor therapy.  She has lost some weight.  She is still overweight..  All other systems reviewed and are negative.  EKGs/Labs/Other Studies Reviewed:    The following studies were reviewed today: No cardiac functional or imaging data has ever been performed.  EKG:  EKG the tracing performed 07/13/2018 demonstrated nonspecific inferior T wave abnormality, prominent voltage, but otherwise unremarkable.  The electrocardiogram performed on September 29, 2019 demonstrates poor R wave progression, nonspecific inferior ST-T wave abnormality, and no significant difference when compared to prior tracing.  Recent Labs: No results found for requested labs within last 8760 hours.  Recent Lipid Panel No results found for: CHOL, TRIG, HDL, CHOLHDL, VLDL, LDLCALC, LDLDIRECT  Physical Exam:    VS:  BP 124/76   Pulse 60   Ht 5\' 7"  (1.702 m)   Wt 214 lb (97.1 kg)   SpO2 98%   BMI 33.52 kg/m     Wt Readings from Last 3 Encounters:  09/29/19 214 lb (97.1 kg)  07/12/18 242 lb (109.8 kg)  03/22/18 249 lb (112.9  kg)     GEN: Moderate obesity. No acute distress HEENT: Normal NECK: No JVD. LYMPHATICS: No lymphadenopathy CARDIAC: There is a 2/6 right upper sternal border and left mid sternal border systolic murmur.  RRR without murmur, gallop, or edema. VASCULAR:  Normal Pulses. No bruits. RESPIRATORY:  Clear to auscultation without rales, wheezing or rhonchi  ABDOMEN: Soft, non-tender, non-distended, No pulsatile mass, MUSCULOSKELETAL: No deformity  SKIN: Warm and dry NEUROLOGIC:  Alert and oriented x 3 PSYCHIATRIC:  Normal affect   ASSESSMENT:    1. Precordial pain   2. Hyperlipidemia with target LDL less than 100  3. Heart murmur, systolic   4. Morbid obesity ([place])   5. Educated about COVID-19 virus infection    PLAN:    In order of problems listed above:  1. Recurring precordial chest discomfort in a patient with risk factors that include obesity, hyperlipidemia, and family history.  We will plan to do coronary CT with FFR if indicated.  Risk factor modification and other directed therapy may be employed depending upon findings.  She is the phenotype that could have microvascular dysfunction causing atypical anginal symptoms. 2. She is on moderate intensity statin therapy.  A lipid panel needs to be updated. 3. Rule out bicuspid aortic valve.  2D Doppler echocardiogram will be performed. 4. I encourage decreasing carbohydrate intake, increasing physical activity to achieve 150 to 300 minutes of moderate activity per week.  I did not query her about symptoms of sleep apnea which needs to be considered as well. 5. The 3W's is being practiced to avoid COVID-19 infection.  Clinical follow-up in 1 year or earlier depending upon cardiac evaluation.  It is important to proceed with further cardiac evaluation due to multiple emergency room visits and concerning symptoms with associated risk factors.   Medication Adjustments/Labs and Tests Ordered: Current medicines are reviewed at length with  the patient today.  Concerns regarding medicines are outlined above.  Orders Placed This Encounter  Procedures  . CT CORONARY MORPH W/CTA COR W/SCORE W/CA W/CM &/OR WO/CM  . CT CORONARY FRACTIONAL FLOW RESERVE DATA PREP  . CT CORONARY FRACTIONAL FLOW RESERVE FLUID ANALYSIS  . Basic metabolic panel  . EKG 12-Lead  . ECHOCARDIOGRAM COMPLETE   Meds ordered this encounter  Medications  . metoprolol tartrate (LOPRESSOR) 100 MG tablet    Sig: Take one tablet by mouth 2 hours prior to your CT    Dispense:  1 tablet    Refill:  0    Patient Instructions  Medication Instructions:  Your physician recommends that you continue on your current medications as directed. Please refer to the Current Medication list given to you today.  *If you need a refill on your cardiac medications before your next appointment, please call your pharmacy*  Lab Work: You will need a BMET prior to your CT  If you have labs (blood work) drawn today and your tests are completely normal, you will receive your results only by: Marland Kitchen MyChart Message (if you have MyChart) OR . A paper copy in the mail If you have any lab test that is abnormal or we need to change your treatment, we will call you to review the results.  Testing/Procedures: Your physician has requested that you have an echocardiogram. Echocardiography is a painless test that uses sound waves to create images of your heart. It provides your doctor with information about the size and shape of your heart and how well your heart's chambers and valves are working. This procedure takes approximately one hour. There are no restrictions for this procedure.  Your physician recommends that you have a Coronary CT performed.  Follow-Up: At [facility] you and your health needs are our priority.  As part of our continuing mission to provide you with exceptional heart care, we have created designated Provider Care Teams.  These Care Teams include your primary  Cardiologist (physician) and Advanced Practice Providers (APPs -  Physician Assistants and Nurse Practitioners) who all work together to provide you with the care you need, when you need it.  Your next appointment:   12 month(s)  The  format for your next appointment:   In Person  Provider:   You may see Sinclair Grooms, MD or one of the following Advanced Practice Providers on your designated Care Team:    Truitt Merle, NP  Cecilie Kicks, NP  Kathyrn Drown, NP   Other Instructions   Your cardiac CT will be scheduled at one of the below locations:   [facility] [address] [place], [place] [address] [phone]  [place] [address] [place], [place] [address] [phone]  If scheduled at [facility], please arrive at the [facility] main entrance of [facility] 30-45 minutes prior to test start time. Proceed to the [facility] Radiology Department (first floor) to check-in and test prep.  If scheduled at [facility], please arrive 15 mins early for check-in and test prep.  Please follow these instructions carefully (unless otherwise directed):  On the Night Before the Test: . Be sure to Drink plenty of water. . Do not consume any caffeinated/decaffeinated beverages or chocolate 12 hours prior to your test. . Do not take any antihistamines 12 hours prior to your test.  On the Day of the Test: . Drink plenty of water. Do not drink any water within one hour of the test. . Do not eat any food 4 hours prior to the test. . You may take your regular medications prior to the test.  . Take metoprolol (Lopressor) two hours prior to test. . HOLD Furosemide/Hydrochlorothiazide morning of the test. . FEMALES- please wear underwire-free bra if available       After the Test: . Drink plenty of water. . After receiving IV contrast, you may experience a mild  flushed feeling. This is normal. . On occasion, you may experience a mild rash up to 24 hours after the test. This is not dangerous. If this occurs, you can take Benadryl 25 mg and increase your fluid intake. . If you experience trouble breathing, this can be serious. If it is severe call 911 IMMEDIATELY. If it is mild, please call our office. . If you take any of these medications: Glipizide/Metformin, Avandament, Glucavance, please do not take 48 hours after completing test unless otherwise instructed.   Once we have confirmed authorization from your insurance company, we will call you to set up a date and time for your test.   For non-scheduling related questions, please contact the cardiac imaging nurse navigator should you have any questions/concerns: Marchia Bond, RN Navigator Cardiac Imaging [facility] [phone] Office        Signed, Sinclair Grooms, MD  09/29/2019 8:44 AM    [place]

## 2019-09-29 ENCOUNTER — Other Ambulatory Visit: Payer: Self-pay

## 2019-09-29 ENCOUNTER — Encounter: Payer: Self-pay | Admitting: Interventional Cardiology

## 2019-09-29 ENCOUNTER — Ambulatory Visit: Payer: BC Managed Care – PPO | Admitting: Interventional Cardiology

## 2019-09-29 VITALS — BP 124/76 | HR 60 | Ht 67.0 in | Wt 214.0 lb

## 2019-09-29 DIAGNOSIS — E785 Hyperlipidemia, unspecified: Secondary | ICD-10-CM

## 2019-09-29 DIAGNOSIS — R011 Cardiac murmur, unspecified: Secondary | ICD-10-CM | POA: Diagnosis not present

## 2019-09-29 DIAGNOSIS — R072 Precordial pain: Secondary | ICD-10-CM | POA: Diagnosis not present

## 2019-09-29 DIAGNOSIS — Z7189 Other specified counseling: Secondary | ICD-10-CM

## 2019-09-29 MED ORDER — METOPROLOL TARTRATE 100 MG PO TABS: ORAL_TABLET | ORAL | 0 refills | Status: DC

## 2019-09-29 NOTE — Patient Instructions (Signed)
Medication Instructions:  Your physician recommends that you continue on your current medications as directed. Please refer to the Current Medication list given to you today.  *If you need a refill on your cardiac medications before your next appointment, please call your pharmacy*  Lab Work: You will need a BMET prior to your CT  If you have labs (blood work) drawn today and your tests are completely normal, you will receive your results only by: Marland Kitchen MyChart Message (if you have MyChart) OR . A paper copy in the mail If you have any lab test that is abnormal or we need to change your treatment, we will call you to review the results.  Testing/Procedures: Your physician has requested that you have an echocardiogram. Echocardiography is a painless test that uses sound waves to create images of your heart. It provides your doctor with information about the size and shape of your heart and how well your heart's chambers and valves are working. This procedure takes approximately one hour. There are no restrictions for this procedure.  Your physician recommends that you have a Coronary CT performed.  Follow-Up: At [facility], you and your health needs are our priority.  As part of our continuing mission to provide you with exceptional heart care, we have created designated Provider Care Teams.  These Care Teams include your primary Cardiologist (physician) and Advanced Practice Providers (APPs -  Physician Assistants and Nurse Practitioners) who all work together to provide you with the care you need, when you need it.  Your next appointment:   12 month(s)  The format for your next appointment:   In Person  Provider:   You may see Sinclair Grooms, MD or one of the following Advanced Practice Providers on your designated Care Team:    Truitt Merle, NP  Cecilie Kicks, NP  Kathyrn Drown, NP   Other Instructions   Your cardiac CT will be scheduled at one of the below locations:    [facility] [address] [place], [place] [address] [phone]  [place] [address] [place], [place] [address] [phone]  If scheduled at [facility], please arrive at the [facility] main entrance of [facility] 30-45 minutes prior to test start time. Proceed to the [facility] Radiology Department (first floor) to check-in and test prep.  If scheduled at [facility], please arrive 15 mins early for check-in and test prep.  Please follow these instructions carefully (unless otherwise directed):  On the Night Before the Test: . Be sure to Drink plenty of water. . Do not consume any caffeinated/decaffeinated beverages or chocolate 12 hours prior to your test. . Do not take any antihistamines 12 hours prior to your test.  On the Day of the Test: . Drink plenty of water. Do not drink any water within one hour of the test. . Do not eat any food 4 hours prior to the test. . You may take your regular medications prior to the test.  . Take metoprolol (Lopressor) two hours prior to test. . HOLD Furosemide/Hydrochlorothiazide morning of the test. . FEMALES- please wear underwire-free bra if available       After the Test: . Drink plenty of water. . After receiving IV contrast, you may experience a mild flushed feeling. This is normal. . On occasion, you may experience a mild rash up to 24 hours after the test. This is not dangerous.  If this occurs, you can take Benadryl 25 mg and increase your fluid intake. . If you experience trouble breathing, this can be serious. If it is severe call 911 IMMEDIATELY. If it is mild, please call our office. . If you take any of these medications: Glipizide/Metformin, Avandament, Glucavance, please do not take 48 hours after completing test unless otherwise instructed.   Once we have confirmed authorization from your insurance  company, we will call you to set up a date and time for your test.   For non-scheduling related questions, please contact the cardiac imaging nurse navigator should you have any questions/concerns: Marchia Bond, RN Navigator Cardiac Imaging [facility] [phone] Office

## 2019-10-06 ENCOUNTER — Telehealth: Payer: Self-pay | Admitting: Interventional Cardiology

## 2019-10-06 DIAGNOSIS — E785 Hyperlipidemia, unspecified: Secondary | ICD-10-CM

## 2019-10-06 NOTE — Telephone Encounter (Signed)
Will send this message to our Power, to check on the status of where she is at with scheduling this pts Coronary CT. Freddie Apley to follow-up with the pt.

## 2019-10-06 NOTE — Telephone Encounter (Signed)
Spoke with Freddie Apley about this pt, and she will be working on getting her scheduled for her Coronary CT.  Freddie Apley to follow-up with this pt.

## 2019-10-06 NOTE — Telephone Encounter (Deleted)
Ramond Dial, RPH to [place], [place]     10/06/19 4:20 PM Mrs. Kinyon  A lipid panel has been added to your labs for 12/16. Please make sure you are fasting for 8-10 hours. The lab opens at 7:30 AM. You may come anytime after that.  Thanks,  [employer]

## 2019-10-06 NOTE — Telephone Encounter (Signed)
Patient is having labs done on Wednesday 12/16. She is requesting a lipid panel to be done.

## 2019-10-06 NOTE — Telephone Encounter (Signed)
Lipid panel and CMP ordered per notes from visit with Dr. Tamala Julian

## 2019-10-06 NOTE — Telephone Encounter (Signed)
Ramond Dial, RPH to [place], [place]     10/06/19 4:20 PM Mrs. Brunetti  A lipid panel has been added to your labs for 12/16. Please make sure you are fasting for 8-10 hours. The lab opens at 7:30 AM. You may come anytime after that.  Thanks,  [employer]

## 2019-10-06 NOTE — Telephone Encounter (Signed)
New Message    Pt is calling and would like to schedule her CT     Please call

## 2019-10-07 ENCOUNTER — Ambulatory Visit (HOSPITAL_COMMUNITY): Payer: BC Managed Care – PPO | Attending: Cardiovascular Disease

## 2019-10-07 ENCOUNTER — Other Ambulatory Visit: Payer: BC Managed Care – PPO

## 2019-10-07 ENCOUNTER — Other Ambulatory Visit: Payer: Self-pay | Admitting: *Deleted

## 2019-10-07 ENCOUNTER — Other Ambulatory Visit: Payer: Self-pay

## 2019-10-07 ENCOUNTER — Telehealth: Payer: Self-pay | Admitting: Interventional Cardiology

## 2019-10-07 DIAGNOSIS — E785 Hyperlipidemia, unspecified: Secondary | ICD-10-CM

## 2019-10-07 DIAGNOSIS — R011 Cardiac murmur, unspecified: Secondary | ICD-10-CM | POA: Diagnosis present

## 2019-10-07 DIAGNOSIS — I421 Obstructive hypertrophic cardiomyopathy: Secondary | ICD-10-CM

## 2019-10-07 LAB — LIPID PANEL
Chol/HDL Ratio: 2.6 ratio (ref 0.0–4.4)
Cholesterol, Total: 201 mg/dL — ABNORMAL HIGH (ref 100–199)
HDL: 77 mg/dL (ref >39)
LDL Chol Calc (NIH): 110 mg/dL — ABNORMAL HIGH (ref 0–99)
Triglycerides: 76 mg/dL (ref 0–149)
VLDL Cholesterol Cal: 14 mg/dL (ref 5–40)

## 2019-10-07 LAB — COMPREHENSIVE METABOLIC PANEL
ALT: 10 IU/L (ref 0–32)
AST: 17 IU/L (ref 0–40)
Albumin/Globulin Ratio: 1.9 (ref 1.2–2.2)
Albumin: 4.1 g/dL (ref 3.8–4.8)
Alkaline Phosphatase: 42 IU/L (ref 39–117)
BUN/Creatinine Ratio: 14 (ref 9–23)
BUN: 13 mg/dL (ref 6–24)
Bilirubin Total: 0.8 mg/dL (ref 0.0–1.2)
CO2: 27 mmol/L (ref 20–29)
Calcium: 9.6 mg/dL (ref 8.7–10.2)
Chloride: 100 mmol/L (ref 96–106)
Creatinine, Ser: 0.92 mg/dL (ref 0.57–1.00)
GFR calc Af Amer: 85 mL/min/{1.73_m2} (ref >59)
GFR calc non Af Amer: 74 mL/min/{1.73_m2} (ref >59)
Globulin, Total: 2.2 g/dL (ref 1.5–4.5)
Glucose: 104 mg/dL — ABNORMAL HIGH (ref 65–99)
Potassium: 3.9 mmol/L (ref 3.5–5.2)
Sodium: 139 mmol/L (ref 134–144)
Total Protein: 6.3 g/dL (ref 6.0–8.5)

## 2019-10-07 NOTE — Telephone Encounter (Signed)
New Message  Patient would like a call back to go over her results. Please give patient a call back.

## 2019-10-07 NOTE — Telephone Encounter (Signed)
Called patient to let her know that her lab results have not come back yet- however patient wanted to discuss her echo results. I have let Maude Leriche know that patient has questions about echo and she will give her a call

## 2019-10-08 ENCOUNTER — Other Ambulatory Visit: Payer: Self-pay | Admitting: Family Medicine

## 2019-10-08 ENCOUNTER — Other Ambulatory Visit: Payer: BC Managed Care – PPO

## 2019-10-08 ENCOUNTER — Encounter: Payer: Self-pay | Admitting: *Deleted

## 2019-10-08 ENCOUNTER — Other Ambulatory Visit (HOSPITAL_COMMUNITY): Payer: BC Managed Care – PPO

## 2019-10-08 ENCOUNTER — Telehealth: Payer: Self-pay | Admitting: Interventional Cardiology

## 2019-10-08 ENCOUNTER — Telehealth: Payer: Self-pay | Admitting: *Deleted

## 2019-10-08 DIAGNOSIS — Z1231 Encounter for screening mammogram for malignant neoplasm of breast: Secondary | ICD-10-CM

## 2019-10-08 NOTE — Telephone Encounter (Signed)
Ok per DPR to leave detailed message on VM.  Left message letting pt know I am waiting to hear from Dr. Tamala Julian on whether or not we are going to do CT.  Advised I will call as soon as I hear back from him.

## 2019-10-08 NOTE — Telephone Encounter (Signed)
New Message  Patient is wanting to follow up and see if Dr. Tamala Julian still wants her to get the cardiac CT. Please give patient a call back to discuss.

## 2019-10-08 NOTE — Telephone Encounter (Signed)
Left message regarding appointment for Cardiac MRI scheduled 11/06/19 at 12:00pm----arrival time 11:15 am  1st floor admissions office at [facility] for check in---will mail information to patient and it is also in My Chart

## 2019-10-09 NOTE — Telephone Encounter (Signed)
Received message from Dr. Tamala Julian that we will hold off on CT for now and get Cardiac MRI first. Pt aware and appreciative for call.

## 2019-10-24 DIAGNOSIS — I421 Obstructive hypertrophic cardiomyopathy: Secondary | ICD-10-CM

## 2019-10-24 HISTORY — DX: Obstructive hypertrophic cardiomyopathy: I42.1

## 2019-10-29 ENCOUNTER — Telehealth: Payer: Self-pay | Admitting: Interventional Cardiology

## 2019-10-29 NOTE — Telephone Encounter (Signed)
New message   Patient wants to know if someone can go after MRI results with her before her next appt with Dr. Tamala Julian. Please call to discuss.

## 2019-10-29 NOTE — Telephone Encounter (Signed)
The patient is scheduled for a Cardiac MRI on 1/14 and was wondering if she would get results prior to her 1/29 appt with Dr Tamala Julian.  I told her that we would call her when results are available.  She verbalized understanding.

## 2019-11-04 ENCOUNTER — Encounter (HOSPITAL_COMMUNITY): Payer: Self-pay

## 2019-11-05 ENCOUNTER — Telehealth (HOSPITAL_COMMUNITY): Payer: Self-pay | Admitting: Emergency Medicine

## 2019-11-05 NOTE — Telephone Encounter (Signed)
Left message on voicemail with name and callback number Cantrell Martus RN Navigator Cardiac Imaging [facility] [phone] Office [phone] Cell  

## 2019-11-06 ENCOUNTER — Ambulatory Visit (HOSPITAL_COMMUNITY)
Admission: RE | Admit: 2019-11-06 | Discharge: 2019-11-06 | Disposition: A | Discharge status: Home / Self Care | Payer: BC Managed Care – PPO | Source: Ambulatory Visit | Attending: Interventional Cardiology | Admitting: Interventional Cardiology

## 2019-11-06 ENCOUNTER — Other Ambulatory Visit: Payer: Self-pay

## 2019-11-06 DIAGNOSIS — I421 Obstructive hypertrophic cardiomyopathy: Secondary | ICD-10-CM

## 2019-11-06 MED ORDER — GADOBUTROL 1 MMOL/ML IV SOLN
12.0000 mL | Freq: Once | INTRAVENOUS | Status: AC | PRN
  Administered 2019-11-06: 12 mL via INTRAVENOUS

## 2019-11-07 ENCOUNTER — Telehealth: Payer: Self-pay | Admitting: Interventional Cardiology

## 2019-11-07 NOTE — Telephone Encounter (Signed)
New Message   Patient is calling to review her MRI results that she had on 1/14. She see the results on mychart but not sure what it means. Please call to discuss.

## 2019-11-07 NOTE — Telephone Encounter (Signed)
Spoke with pt and made her aware that I am waiting for Dr. Tamala Julian to review these results and I will call as soon as I hear from him.  Pt verbalized understanding and was appreciative for call.

## 2019-11-12 ENCOUNTER — Encounter: Payer: Self-pay | Admitting: Physician Assistant

## 2019-11-12 ENCOUNTER — Ambulatory Visit: Payer: BC Managed Care – PPO | Admitting: Physician Assistant

## 2019-11-12 ENCOUNTER — Other Ambulatory Visit: Payer: Self-pay

## 2019-11-12 VITALS — BP 126/70 | HR 63 | Ht 67.0 in | Wt 205.0 lb

## 2019-11-12 DIAGNOSIS — E785 Hyperlipidemia, unspecified: Secondary | ICD-10-CM

## 2019-11-12 DIAGNOSIS — I10 Essential (primary) hypertension: Secondary | ICD-10-CM

## 2019-11-12 DIAGNOSIS — I421 Obstructive hypertrophic cardiomyopathy: Secondary | ICD-10-CM

## 2019-11-12 NOTE — Patient Instructions (Addendum)
Medication Instructions:   Your physician recommends that you continue on your current medications as directed. Please refer to the Current Medication list given to you today.  *If you need a refill on your cardiac medications before your next appointment, please call your pharmacy*  Lab Work:  None ordered today  If you have labs (blood work) drawn today and your tests are completely normal, you will receive your results only by: Marland Kitchen MyChart Message (if you have MyChart) OR . A paper copy in the mail If you have any lab test that is abnormal or we need to change your treatment, we will call you to review the results.  Testing/Procedures:  Your physician has requested that you have an exercise tolerance test. For further information please visit [email or web address]. Please also follow instruction sheet, as given.  A zio monitor was ordered today. It will remain on for 14 days. You will then return monitor and event diary in provided box. It takes 1-2 weeks for report to be downloaded and returned to [place]. We will call you with the results. If monitor falls off or has orange flashing light, please call [employer] for further instructions.    Follow-Up: At [facility], you and your health needs are our priority.  As part of our continuing mission to provide you with exceptional heart care, we have created designated Provider Care Teams.  These Care Teams include your primary Cardiologist (physician) and Advanced Practice Providers (APPs -  Physician Assistants and Nurse Practitioners) who all work together to provide you with the care you need, when you need it.  Your next appointment:   4 month(s)  The format for your next appointment:   In Person  Provider:   Daneen Schick, MD

## 2019-11-12 NOTE — Progress Notes (Signed)
Cardiology Office Note:    Date:  11/12/2019   ID:  [name], DOB 05/24/1971, MRN [id number]  PCP:  Marda Stalker, PA-C  Cardiologist:  Sinclair Grooms, MD  Electrophysiologist:  None   Referring MD: Marda Stalker, PA-C   Chief Complaint  Patient presents with  . Follow-up    new dx of HOCM    History of Present Illness:    [name] is a 49 y.o. female with:   Hypertrophic Obstructive Cardiomyopathy    cMRI 10/2019: basal septum 21 mm, + SAM, no high risk features (no LGE; T2, ECV normal)  Echocardiogram 10/2019: LVOT 120 mmHg  Hypertension   Hyperlipidemia   Palpitations  Obesity   GERD  [name] was recently evaluated by Dr. Tamala Julian for chest discomfort and a cardiac murmur.  [name] was initially set up for coronary CTA with FFR.  However, her echocardiogram demonstrated severe asymmetric basal septal hypertrophy with an LVOT gradient of 123456 mmHg and systolic anterior motion of the mitral valve leaflet.  Findings were consistent with hypertrophic obstructive cardiomyopathy.  Coronary CTA with FFR was canceled and [name] was set up for cardiac MRI.  This confirmed hypertrophic obstructive cardiomyopathy.  There were no high risk features.  [name] returns for follow-up.  [name] is here alone today.  [name] has not had any exertional chest pain, significant shortness of breath.  [name] has not had syncope, orthopnea, leg swelling.  [name] exercises on a regular basis without difficulty.       Prior CV studies:   The following studies were reviewed today:  Cardiac MRI 11/06/2019 IMPRESSION: 1. Mild LVE with severe asymmetric basal septal hypertrophy measuring 21 mm. Normal LVEF 74% 2. SAM of anterior mitral valve leaflet with LVOT gradient created by anterior mitral leaflet and large hypertrophied anteriorly displaced papillary muscle Suggest echo correlation 3.  Mild MR 4.  Mild LAE 5. No high risk features other than septal thickness with no  delayed gadolinium uptake in septum, mildly prolonged T1 times and normal T2 and ECV   Echocardiogram 09/29/2019 Severe asymmetric basal septal hypertrophy, LVOT gradient at rest: 120 mmHg (no apparent increase with Valsalva), +SAM (c/w HOCM), EF 60-65, severe LVH, Gr 1 DD, no RWMA, +pericardial fat Peripheral Arterial Disease , mild MR, trivial TR, RVSP 32.4  Past Medical History:  Diagnosis Date  . Anxiety   . Family history of breast cancer   . GERD (gastroesophageal reflux disease)   . Heart murmur   . History of colonic polyps 05/27/2018  . Hyperlipidemia   . Hypertension   . Panic disorder    Surgical Hx: The patient  has a past surgical history that includes Dilation and curettage of uterus; Colonoscopy; and Polypectomy.   Current Medications: Current Meds  Medication Sig  . busPIRone (BUSPAR) 10 MG tablet Take 30 mg by mouth 2 (two) times daily.   . clonazePAM (KLONOPIN) 0.5 MG tablet Take 0.5 mg by mouth daily as needed.  Marland Kitchen escitalopram (LEXAPRO) 20 MG tablet Take 30 mg by mouth daily.  Marland Kitchen losartan-hydrochlorothiazide (HYZAAR) 50-12.5 MG tablet Take 1 tablet by mouth daily.  . pravastatin (PRAVACHOL) 40 MG tablet Take 1 tablet by mouth daily.  . [DISCONTINUED] metoprolol tartrate (LOPRESSOR) 100 MG tablet Take one tablet by mouth 2 hours prior to your CT   Current Facility-Administered Medications for the 11/12/19 encounter (Office Visit) with Richardson Dopp T, PA-C  Medication  . 0.9 %  sodium chloride infusion  . 0.9 %  sodium chloride infusion     Allergies:   Ciprofloxacin   Social History   Tobacco Use  . Smoking status: Never Smoker  . Smokeless tobacco: Never Used  Substance Use Topics  . Alcohol use: No  . Drug use: No     Family Hx: The patient's family history includes Breast cancer (age of onset: 29) in her maternal aunt; Cancer (age of onset: 51) in her sister; Colon cancer in an other family member; Colon polyps in her father; Diabetes Mellitus I in  her maternal grandmother; Diverticulitis in her mother; GI Bleed in her mother; Heart attack in her mother and paternal grandfather; Heart disease in her maternal grandmother; Hyperlipidemia in her mother; Hypertension in her father, mother, and sister; Irritable bowel syndrome in her mother; Lung cancer (age of onset: 34) in her maternal aunt; Other in her sister. There is no history of Esophageal cancer, Stomach cancer, Rectal cancer, or Sudden Cardiac Death.  ROS:   Please see the history of present illness.    ROS All other systems reviewed and are negative.   EKGs/Labs/Other Test Reviewed:    EKG:  EKG is not ordered today.  The ekg ordered today demonstrates n/a  Recent Labs: 10/07/2019: ALT 10; BUN 13; Creatinine, Ser 0.92; Potassium 3.9; Sodium 139   Recent Lipid Panel Lab Results  Component Value Date/Time   CHOL 201 (H) 10/07/2019 09:03 AM   TRIG 76 10/07/2019 09:03 AM   HDL 77 10/07/2019 09:03 AM   CHOLHDL 2.6 10/07/2019 09:03 AM   LDLCALC 110 (H) 10/07/2019 09:03 AM    Physical Exam:    VS:  BP 126/70   Pulse 63   Ht 5\' 7"  (1.702 m)   Wt 205 lb (93 kg)   SpO2 98%   BMI 32.11 kg/m     Wt Readings from Last 3 Encounters:  11/12/19 205 lb (93 kg)  09/29/19 214 lb (97.1 kg)  07/12/18 242 lb (109.8 kg)     Physical Exam  Constitutional: [name] is oriented to person, place, and time. [name] appears well-developed and well-nourished. No distress.  HENT:  Head: Normocephalic and atraumatic.  Eyes: No scleral icterus.  Neck: No JVD present. No thyromegaly present.  Cardiovascular: Normal rate and regular rhythm.  Murmur heard.  Holosystolic murmur is present with a grade of 3/6 at the upper right sternal border and upper left sternal border. The intensity increases with valsalva. Pulmonary/Chest: Effort normal and breath sounds normal. [name] has no rales.  Abdominal: Soft. There is no hepatomegaly.  Musculoskeletal:        General: No edema.  Lymphadenopathy:    [name]  has no cervical adenopathy.  Neurological: [name] is alert and oriented to person, place, and time.  Skin: Skin is warm and dry.  Psychiatric: [name] has a normal mood and affect.    ASSESSMENT & PLAN:    1. HOCM (hypertrophic obstructive cardiomyopathy) (HCC) Echocardiogram and cMRI confirm septal hypertrophy (septum measured 21 mm).  There were no high risk features on MRI.  LVOT gradient was 120 mmHg on Echocardiogram.  Overall, [name] does not have any symptoms related to Hypertrophic Obstructive Cardiomyopathy .  [name] has a HR in the low 60s and a well controlled BP.  Therefore, I will hold off on starting beta-blocker therapy at this time.  In the future, if we feel [name] is having symptomatic HOCM, we can start her on beta-blocker at that time.  I reviewed the findings on echocardiogram and MRI with her  today.  We discussed the pathophysiology and management of HOCM.  I answered all of her questions.  I did discuss her case with Dr. Tamala Julian earlier today.  We will proceed with testing for risk stratification and refer her to the [facility]F Clinic for a one time evaluation as well as our geneticist for possible genetic testing.  -Arrange 14 day Zio patch monitor to r/o NSVT/VTach  -Arrange ETT to rule out exercise induced hypotension  -Refer to [facility] for evaluation  -Refer to Dr. [name] for genetic counseling  2. Essential hypertension BP is controlled.  We did discuss the possibility of stopping her HCTZ at some point to avoid dehydration.  [name] has done well on this medication and does have a hx of leg swelling that improved with HCTZ.  I advised her to avoid dehydration and to hold this medication if [name] develops high fevers, vomiting or diarrhea in the future.    3. Hyperlipidemia with target LDL less than 100 [name] remains on high dose statin Rx.     Dispo:  Return in about 3 months (around 02/10/2020) for Routine Follow Up w/ Dr. Tamala Julian, in person.   Medication Adjustments/Labs and Tests  Ordered: Current medicines are reviewed at length with the patient today.  Concerns regarding medicines are outlined above.  Tests Ordered: Orders Placed This Encounter  Procedures  . AMB referral to CHF clinic  . Ambulatory referral to Genetics  . LONG TERM MONITOR (3-14 DAYS)  . Exercise Tolerance Test   Medication Changes: No orders of the defined types were placed in this encounter.   Signed, Richardson Dopp, PA-C  11/12/2019 5:12 PM    [facility] [place], [place], [place]  [address] Phone: [phone]; Fax: [phone]

## 2019-11-13 ENCOUNTER — Telehealth: Payer: Self-pay | Admitting: Radiology

## 2019-11-13 NOTE — Telephone Encounter (Signed)
Enrolled patient for a 14 day Zio monitor to be mailed to patients home.  

## 2019-11-14 ENCOUNTER — Other Ambulatory Visit (HOSPITAL_COMMUNITY)
Admission: RE | Admit: 2019-11-14 | Discharge: 2019-11-14 | Disposition: A | Discharge status: Home / Self Care | Payer: BC Managed Care – PPO | Source: Ambulatory Visit | Attending: Physician Assistant | Admitting: Physician Assistant

## 2019-11-14 DIAGNOSIS — Z01812 Encounter for preprocedural laboratory examination: Secondary | ICD-10-CM | POA: Insufficient documentation

## 2019-11-14 DIAGNOSIS — Z20822 Contact with and (suspected) exposure to covid-19: Secondary | ICD-10-CM | POA: Diagnosis not present

## 2019-11-14 LAB — SARS CORONAVIRUS 2 (TAT 6-24 HRS): SARS Coronavirus 2: NEGATIVE

## 2019-11-18 ENCOUNTER — Other Ambulatory Visit: Payer: Self-pay

## 2019-11-18 ENCOUNTER — Ambulatory Visit (INDEPENDENT_AMBULATORY_CARE_PROVIDER_SITE_OTHER): Payer: BC Managed Care – PPO

## 2019-11-18 ENCOUNTER — Other Ambulatory Visit (INDEPENDENT_AMBULATORY_CARE_PROVIDER_SITE_OTHER): Payer: BC Managed Care – PPO

## 2019-11-18 DIAGNOSIS — I421 Obstructive hypertrophic cardiomyopathy: Secondary | ICD-10-CM | POA: Diagnosis not present

## 2019-11-18 LAB — EXERCISE TOLERANCE TEST
Estimated workload: 13.4 METS
Exercise duration (min): 10 min
Exercise duration (sec): 0 s
MPHR: 172 {beats}/min
Peak HR: 164 {beats}/min
Percent HR: 95 %
RPE: 19
Rest HR: 164 {beats}/min

## 2019-11-19 ENCOUNTER — Ambulatory Visit (INDEPENDENT_AMBULATORY_CARE_PROVIDER_SITE_OTHER): Payer: BC Managed Care – PPO | Admitting: Psychiatry

## 2019-11-19 ENCOUNTER — Encounter: Payer: Self-pay | Admitting: Psychiatry

## 2019-11-19 VITALS — BP 108/67 | HR 74 | Ht 67.0 in | Wt 200.0 lb

## 2019-11-19 DIAGNOSIS — F410 Panic disorder [episodic paroxysmal anxiety] without agoraphobia: Secondary | ICD-10-CM

## 2019-11-19 DIAGNOSIS — F411 Generalized anxiety disorder: Secondary | ICD-10-CM

## 2019-11-19 MED ORDER — BUSPIRONE HCL 30 MG PO TABS: 30.0000 mg | ORAL_TABLET | Freq: Two times a day (BID) | ORAL | 0 refills | Status: DC

## 2019-11-19 MED ORDER — PAROXETINE HCL 20 MG PO TABS: 40.0000 mg | ORAL_TABLET | Freq: Every day | ORAL | 0 refills | Status: DC

## 2019-11-19 NOTE — Progress Notes (Addendum)
[facility] MD/PA/NP Initial Note  11/19/2019 5:35 PM [name]  MRN:  [id number]  Chief Complaint:  Chief Complaint    Follow-up; Anxiety; Medication Problem     persistent underlying general anxiety  But waxes and waness  HPI:  Anxiety awakens her early about 5  Am with fast heartbeat and numb tingling feelings in arms and hands.  Will get better and then recur throughout the day.  Can't eat in the morning with nausea.  Avoiding little but D with travel VB and it makes her nervous to go alone to tourneys.     Had been great on paroxetine for 20 years with paroxetine from PCP with lower dosages.  Jan 2020 anxiety started worsening consistently.  Only possible trigger Principal's H died of leukemia and pt tends to worry.  She started worrying about her health and possible death with some obsessive thoughts.  But able to let that go until recent heart dz dx.  Most of that time on 20 mg paroxetine.  But on 10 mg at time of worsening anxiety.  Increased to 20 for a month then 30 for a month.  Then switched to Zoloft up to 150 mg for about 8 weeks.  Not much change.  Anxiety a little better but still needed Klonopin 0.5 mg 1/2 daily with benefit.  PCP didn't like Klonopin.  Tried to get in here but couldn't so got into see Pauline Good NP since June.  Started on Lexapro and at 20 mg  With Buspar 10 BID for 8 weeks.  Good for 2 mos and then end of July anxiety increased so increased Lexapro to 30 mg and Buspar 20 BID and better again until about October.  Continued clonazepam.  Just didn't feel it was controlled.  2 weeks ago in Buspar 30 BID with little difference.    No SE with any of the SSRI's.    Patient reports stable mood and denies depressed or irritable moods. Minimal anxiety unless anxiety unmanaged.   Some crying with anxiety.  Patient denies difficulty with sleep initiation or maintenance with 8 hours. Denies appetite disturbance.  Patient reports that energy and motivation have  been good.  Patient denies any difficulty with concentration.  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  Just dx hypertrophic obstructive cardiomyopathy.  Stress test yesterday was OK.  Heart monitor now.  Probably no surgery involved.    Visit Diagnosis:    ICD-10-CM   1. Generalized anxiety disorder  F41.1 PARoxetine (PAXIL) 20 MG tablet    busPIRone (BUSPAR) 30 MG tablet  2. Panic disorder without agoraphobia  F41.0 PARoxetine (PAXIL) 20 MG tablet    busPIRone (BUSPAR) 30 MG tablet    Past Psychiatric History: Initially seen at [facility] December 1999 and diagnosed with panic disorder with agoraphobia and started on paroxetine increased to 40 mg eventually reduced to 30 mg.  Last seen February 2007 on paroxetine 30 mg daily with good response.  Past Psychiatric Medication Trials: Venlafaxine nausea and vomiting Lexapro 30 Paroxetine 40 Sertraline 150 for 8 weeks Buspar 30 BID Klonopin  Past Medical History:  Past Medical History:  Diagnosis Date  . Anxiety   . Family history of breast cancer   . GERD (gastroesophageal reflux disease)   . Heart murmur   . History of colonic polyps 05/27/2018  . Hyperlipidemia   . Hypertension   . Hypertrophic obstructive cardiomyopathy (HOCM) ([place]) 10/2019   Per cardiology note  . Panic disorder  Past Surgical History:  Procedure Laterality Date  . COLONOSCOPY    . DILATION AND CURETTAGE OF UTERUS     for miscarriage  . POLYPECTOMY      Family Psychiatric History: Sister history of panic disorder with alprazolam and fluoxetine switched to fluvoxamine due to side effects M some anxiety and takes prn Klonopin  Family History:  Family History  Problem Relation Age of Onset  . Hyperlipidemia Mother   . Hypertension Mother   . Heart attack Mother        May 2017, smoker  . GI Bleed Mother        Aug. 2017  . Diverticulitis Mother        Aug. 2017  . Irritable bowel syndrome Mother   . Hypertension Father   .  Colon polyps Father   . Hypertension Sister   . Heart disease Maternal Grandmother   . Diabetes Mellitus I Maternal Grandmother   . Heart attack Paternal Grandfather   . Colon cancer Other        mat great uncle, dx in 47's  . Breast cancer Maternal Aunt 38  . Lung cancer Maternal Aunt 21  . Cancer Sister 3       eye tumor/cancer- unk the name  . Other Sister        Brain tumors  . Esophageal cancer Neg Hx   . Stomach cancer Neg Hx   . Rectal cancer Neg Hx   . 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 Lost 50# since Feb with Lyn Henri MD - no weight loss supplements nor drugs  Social History   Socioeconomic History  . Marital status: Married    Spouse name: Not on file  . Number of children: 2  . Years of education: Not on file  . Highest education level: Not on file  Occupational History  . Occupation: Pharmacist, hospital   Tobacco Use  . Smoking status: Never Smoker  . Smokeless tobacco: Never Used  Substance and Sexual Activity  . Alcohol use: No  . Drug use: No  . Sexual activity: Not on file  Other Topics Concern  . Not on file  Social History Narrative  . Not on file   Social Determinants of Health   Financial Resource Strain:   . Difficulty of Paying Living Expenses: Not on file  Food Insecurity:   . Worried About Charity fundraiser in the Last Year: Not on file  . Ran Out of Food in the Last Year: Not on file  Transportation Needs:   . Lack of Transportation (Medical): Not on file  . Lack of Transportation (Non-Medical): Not on file  Physical Activity:   . Days of Exercise per Week: Not on file  . Minutes of Exercise per Session: Not on file  Stress:   . Feeling of Stress : Not on file  Social Connections:   . Frequency of Communication with Friends and Family: Not on file  . Frequency  of Social Gatherings with Friends and Family: Not on file  . Attends Religious Services: Not on file  . Active Member of Clubs or Organizations: Not on file  . Attends Archivist Meetings: Not on file  . Marital Status: Not on file    Allergies:  Allergies  Allergen Reactions  . Ciprofloxacin Rash    Metabolic Disorder Labs: No results found for: HGBA1C, MPG No results found for: PROLACTIN Lab Results  Component Value Date   CHOL 201 (H) 10/07/2019   TRIG 76 10/07/2019   HDL 77 10/07/2019   CHOLHDL 2.6 10/07/2019   LDLCALC 110 (H) 10/07/2019   No results found for: TSH  Therapeutic Level Labs: No results found for: LITHIUM No results found for: VALPROATE No components found for:  CBMZ  Current Medications: Current Outpatient Medications  Medication Sig Dispense Refill  . clonazePAM (KLONOPIN) 0.5 MG tablet Take 0.5 mg by mouth daily as needed.    Marland Kitchen escitalopram (LEXAPRO) 20 MG tablet Take 30 mg by mouth daily.    Marland Kitchen losartan-hydrochlorothiazide (HYZAAR) 50-12.5 MG tablet Take 1 tablet by mouth daily.    . pravastatin (PRAVACHOL) 40 MG tablet Take 1 tablet by mouth daily.    . busPIRone (BUSPAR) 30 MG tablet Take 1 tablet (30 mg total) by mouth 2 (two) times daily. 180 tablet 0  . PARoxetine (PAXIL) 20 MG tablet Take 2 tablets (40 mg total) by mouth daily. 180 tablet 0   Current Facility-Administered Medications  Medication Dose Route Frequency Provider Last Rate Last Admin  . 0.9 %  sodium chloride infusion  500 mL Intravenous Continuous Nandigam, Kavitha V, MD      . 0.9 %  sodium chloride infusion  500 mL Intravenous Once Nandigam, Kavitha V, MD        Medication Side Effects: none  Orders placed this visit:  No orders of the defined types were placed in this encounter.   Psychiatric Specialty Exam:  Review of Systems  Constitutional: Negative for appetite change, chills, diaphoresis, fatigue and fever.  HENT: Negative for congestion, dental problem,  ear pain, hearing loss, nosebleeds, rhinorrhea, sinus pain, sneezing and sore throat.   Eyes: Negative for visual disturbance.  Respiratory: Negative for cough, choking, chest tightness and shortness of breath.   Cardiovascular: Positive for palpitations. Negative for chest pain.  Gastrointestinal: Positive for nausea. Negative for abdominal distention, abdominal pain, constipation, diarrhea and vomiting.  Endocrine: Negative for polydipsia, polyphagia and polyuria.  Genitourinary: Negative for difficulty urinating, dyspareunia, dysuria, enuresis and flank pain.  Musculoskeletal: Negative for arthralgias, back pain, gait problem, joint swelling, myalgias and neck pain.  Skin: Negative for rash.  Neurological: Negative for dizziness, tremors, speech difficulty, weakness and headaches.  Psychiatric/Behavioral: Negative for agitation, behavioral problems, confusion, decreased concentration, dysphoric mood, hallucinations, self-injury, sleep disturbance and suicidal ideas. The patient is nervous/anxious. The patient is not hyperactive.     Blood pressure 108/67, pulse 74, height 5\' 7"  (1.702 m), weight 200 lb (90.7 kg).Body mass index is 31.32 kg/m.  General Appearance: Casual  Eye Contact:  Good  Speech:  Clear and Coherent and Normal Rate  Volume:  Normal  Mood:  Anxious  Affect:  Appropriate and Full Range  Thought Process:  Coherent and Descriptions of Associations: Intact  Orientation:  Full (Time, Place, and Person)  Thought Content: Logical   Suicidal Thoughts:  No  Homicidal Thoughts:  No  Memory:  WNL  Judgement:  Good  Insight:  Good  Psychomotor Activity:  Normal  Concentration:  Concentration: Good  Recall:  Good  Fund of Knowledge: Good  Language: Good  Assets:  Communication Skills Desire for Improvement Financial Resources/Insurance Housing Intimacy Leisure Time Resilience Social Support Talents/Skills Transportation Vocational/Educational  ADL's:  Intact   Cognition: WNL  Prognosis:  Good   Screenings:  MDQ negative  Receiving Psychotherapy: Erich Montane for a couple of mos with poor connection  Treatment Plan/Recommendations: 45 min appt with majority in discussion of dx and treatment options. Discussed the variety of SSRIs that are used to treat generalized anxiety disorder and panic disorder.  Discussed her history of response to Paxil but then gradually losing it as the dosage was reduced.  When she was switched back to 40 mg she only took that approximately 1 month which may not been a sufficient time to achieve the full benefit.  Discussed the various side effects and differences between side effects of the various SSRIs.  SSRIs are the treatment of choice in this situation.  She is not getting adequate response from Lexapro 30 mg daily. Option switch back to Paroxetine bc long history of good response and was not on 40 mg daily recently for more than a month.   Discussed the use of off label medications for panic versus continuing to use SSRIs.  We discussed the short-term risks associated with benzodiazepines including sedation and increased fall risk among others.  Discussed long-term side effect risk including dependence, potential withdrawal symptoms, and the potential eventual dose-related risk of dementia.  She agrees to cross taper to paroxetine 40 mg and to give it at least 8 weeks at that dose to get improved response.  Typically paroxetine is more effective for panic than his Lexapro.  Discussed the disadvantages of short half-life and withdrawal symptoms and potential for weight gain.  Continue clonazepam as needed and buspirone full dose 30 mg twice daily.  Answered her questions about dosing of buspirone and side effects.  Follow-up 8 weeks  Purnell Shoemaker, MD

## 2019-11-19 NOTE — Patient Instructions (Signed)
Start paroxetine 20 mg daily and reduce Lexapro to 20 mg daily for 5 days, Then increase paroxetine to 30 mg daily and reduce Lexapro to 10 mg daily for 5 days, Then increase paroxetine to 40 mg daily and stop Lexapro.

## 2019-11-21 ENCOUNTER — Ambulatory Visit: Payer: BC Managed Care – PPO | Admitting: Interventional Cardiology

## 2019-11-25 ENCOUNTER — Ambulatory Visit: Payer: BC Managed Care – PPO

## 2019-11-28 ENCOUNTER — Encounter: Payer: Self-pay | Admitting: *Deleted

## 2019-12-03 ENCOUNTER — Ambulatory Visit: Payer: BC Managed Care – PPO | Admitting: Psychiatry

## 2019-12-11 ENCOUNTER — Encounter: Payer: Self-pay | Admitting: Physician Assistant

## 2019-12-17 ENCOUNTER — Telehealth (HOSPITAL_COMMUNITY): Payer: Self-pay

## 2019-12-17 NOTE — Telephone Encounter (Signed)

## 2019-12-18 ENCOUNTER — Ambulatory Visit (HOSPITAL_COMMUNITY)
Admission: RE | Admit: 2019-12-18 | Discharge: 2019-12-18 | Disposition: A | Discharge status: Home / Self Care | Payer: BC Managed Care – PPO | Source: Ambulatory Visit | Attending: Internal Medicine | Admitting: Internal Medicine

## 2019-12-18 ENCOUNTER — Ambulatory Visit: Payer: BC Managed Care – PPO

## 2019-12-18 ENCOUNTER — Other Ambulatory Visit: Payer: Self-pay

## 2019-12-18 ENCOUNTER — Encounter (HOSPITAL_COMMUNITY): Payer: Self-pay | Admitting: Internal Medicine

## 2019-12-18 VITALS — BP 140/70 | HR 76 | Wt 203.4 lb

## 2019-12-18 DIAGNOSIS — R0683 Snoring: Secondary | ICD-10-CM | POA: Insufficient documentation

## 2019-12-18 DIAGNOSIS — I421 Obstructive hypertrophic cardiomyopathy: Secondary | ICD-10-CM

## 2019-12-18 DIAGNOSIS — R002 Palpitations: Secondary | ICD-10-CM | POA: Diagnosis not present

## 2019-12-18 DIAGNOSIS — Z8249 Family history of ischemic heart disease and other diseases of the circulatory system: Secondary | ICD-10-CM | POA: Diagnosis not present

## 2019-12-18 DIAGNOSIS — F419 Anxiety disorder, unspecified: Secondary | ICD-10-CM | POA: Insufficient documentation

## 2019-12-18 DIAGNOSIS — R0789 Other chest pain: Secondary | ICD-10-CM | POA: Diagnosis not present

## 2019-12-18 DIAGNOSIS — E669 Obesity, unspecified: Secondary | ICD-10-CM | POA: Insufficient documentation

## 2019-12-18 DIAGNOSIS — E785 Hyperlipidemia, unspecified: Secondary | ICD-10-CM | POA: Insufficient documentation

## 2019-12-18 DIAGNOSIS — Z79899 Other long term (current) drug therapy: Secondary | ICD-10-CM | POA: Insufficient documentation

## 2019-12-18 DIAGNOSIS — I10 Essential (primary) hypertension: Secondary | ICD-10-CM | POA: Diagnosis not present

## 2019-12-18 MED ORDER — METOPROLOL TARTRATE 25 MG PO TABS: 25.0000 mg | ORAL_TABLET | Freq: Two times a day (BID) | ORAL | 3 refills | Status: DC

## 2019-12-18 NOTE — Patient Instructions (Signed)
Start Metoprolol 25mg  (1 tab) twice a day   Please provide a list of all relatives that Dr Haroldine Laws can screen.  Please call office at [phone] option 2 if you have any questions or concerns. You can ask to speak with Dr Bensimhon's nurse Nira Conn.    Your physician recommends that you schedule a follow-up appointment in: 6 months with Dr Haroldine Laws. Our office will call you to schedule this appointment.      At the [facility], you and your health needs are our priority. As part of our continuing mission to provide you with exceptional heart care, we have created designated Provider Care Teams. These Care Teams include your primary Cardiologist (physician) and Advanced Practice Providers (APPs- Physician Assistants and Nurse Practitioners) who all work together to provide you with the care you need, when you need it.   You may see any of the following providers on your designated Care Team at your next follow up: Marland Kitchen Dr Glori Bickers . Dr Loralie Champagne . Darrick Grinder, NP . Lyda Jester, PA . Audry Riles, PharmD   Please be sure to bring in all your medications bottles to every appointment.

## 2019-12-18 NOTE — Progress Notes (Signed)
[facility]ED [facility]IN[facility] CONSULT NOTE  Referring Physician: Dr. Pernell Dupre Primary Cardiologist: Dr. Pernell Dupre  HPI:  [name] is a 49 y.o. female with below medical history referred by Dr. Tamala Julian fo f/u of HOCM.    Hypertrophic Obstructive Cardiomyopathy   ? cMRI 10/2019: basal septum 21 mm, + SAM, no high risk features (no LGE; T2, ECV normal) ? Echocardiogram 10/2019: LVOT 120 mmHg  Hypertension   Hyperlipidemia   Palpitations  Obesity   GERD  Ms. Cassels was evaluated by Dr. Tamala Julian in 12/20 for chest discomfort and a cardiac murmur.  She was initially set up for coronary CTA with FFR.  However, her echocardiogram demonstrated severe asymmetric basal septal hypertrophy with an LVOT gradient of 123456 mmHg and systolic anterior motion of the mitral valve leaflet.  Findings were consistent with hypertrophic obstructive cardiomyopathy.  Coronary CTA with FFR was canceled and she was set up for cardiac MRI.  This confirmed hypertrophic obstructive cardiomyopathy.  There were no high risk features.  She has subsequently had ETT with normal BP response and Zio with no arrhythmias. Has been referred fro genetic counseling.    She is here with her husband today. Remains active but not going to the gym for past month due to COVID restrictions. Can exercise without CP or SOB. No dizziness. No syncope or presyncope. Occasional rare flutter. No known FHx of HOCM or SCD. No h/o AF. Has lost 50 pounds in last year. 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   Cardiac MRI 11/06/2019 IMPRESSION: 1. Mild LVE with severe asymmetric basal septal hypertrophy measuring 21 mm. Normal LVEF 74% 2. SAM of anterior mitral valve leaflet with LVOT gradient created by anterior mitral leaflet and large hypertrophied anteriorly displaced papillary muscle Suggest echo correlation 3. Mild MR 4. Mild LAE 5. No high risk features other than septal thickness with no delayed gadolinium uptake in septum, mildly  prolonged T1 times and normal T2 and ECV  ETT 11/18/19 - 13 METS good BP response  Zio 12/11/19 - No arrhythmias    Review of Systems: [y] = yes, [ ]  = no   General: Weight gain [ ] ; Weight loss [ ] ; Anorexia [ ] ; Fatigue [ ] ; Fever [ ] ; Chills [ ] ; Weakness [ ]   Cardiac: Chest pain/pressure [ ] ; Resting SOB [ ] ; Exertional SOB [ ] ; Orthopnea [ ] ; Pedal Edema [ ] ; Palpitations [ ] ; Syncope [ ] ; Presyncope [ ] ; Paroxysmal nocturnal dyspnea[ ]   Pulmonary: Cough [ ] ; Wheezing[ ] ; Hemoptysis[ ] ; Sputum [ ] ; Snoring [ ]   GI: Vomiting[ ] ; Dysphagia[ ] ; Melena[ ] ; Hematochezia [ ] ; Heartburn[ ] ; Abdominal pain [ ] ; Constipation [ ] ; Diarrhea [ ] ; BRBPR [ ]   GU: Hematuria[ ] ; Dysuria [ ] ; Nocturia[ ]   Vascular: Pain in legs with walking [ ] ; Pain in feet with lying flat [ ] ; Non-healing sores [ ] ; Stroke [ ] ; TIA [ ] ; Slurred speech [ ] ;  Neuro: Headaches[ ] ; Vertigo[ ] ; Seizures[ ] ; Paresthesias[ ] ;Blurred vision [ ] ; Diplopia [ ] ; Vision changes [ ]   Ortho/Skin: Arthritis [ ] ; Joint pain [ ] ; Muscle pain [ ] ; Joint swelling [ ] ; Back Pain [ ] ; Rash [ ]   Psych: Depression[ ] ; Anxiety[ ]   Heme: Bleeding problems [ ] ; Clotting disorders [ ] ; Anemia [ ]   Endocrine: Diabetes [ ] ; Thyroid dysfunction[ ]    Past Medical History:  Diagnosis Date  . Anxiety   . Family history of breast cancer   . GERD (gastroesophageal  reflux disease)   . Heart murmur   . History of colonic polyps 05/27/2018  . Hyperlipidemia   . Hypertension   . 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 Panic disorder     Current Outpatient Medications  Medication Sig Dispense Refill  . busPIRone (BUSPAR) 30 MG tablet Take 1 tablet (30 mg total) by mouth 2  (two) times daily. 180 tablet 0  . clonazePAM (KLONOPIN) 0.5 MG tablet Take 0.5 mg by mouth daily as needed.    Marland Kitchen losartan-hydrochlorothiazide (HYZAAR) 50-12.5 MG tablet Take 1 tablet by mouth daily.    Marland Kitchen PARoxetine (PAXIL) 20 MG tablet Take 2 tablets (40 mg total) by mouth daily. 180 tablet 0  . pravastatin (PRAVACHOL) 40 MG tablet Take 1 tablet by mouth daily.     Current Facility-Administered Medications  Medication Dose Route Frequency Provider Last Rate Last Admin  . 0.9 %  sodium chloride infusion  500 mL Intravenous Continuous Nandigam, Kavitha V, MD      . 0.9 %  sodium chloride infusion  500 mL Intravenous Once Nandigam, Venia Minks, MD        Allergies  Allergen Reactions  . Ciprofloxacin Rash      Social History   Socioeconomic History  . Marital status: Married    Spouse name: Not on file  . Number of children: 2  . Years of education: Not on file  . Highest education level: Not on file  Occupational History  . Occupation: Pharmacist, hospital   Tobacco Use  . Smoking status: Never Smoker  . Smokeless tobacco: Never Used  Substance and Sexual Activity  . Alcohol use: No  . Drug use: No  . Sexual activity: Not on file  Other Topics Concern  . Not on file  Social History Narrative  . Not on file   Social Determinants of Health   Financial Resource Strain:   . Difficulty of Paying Living Expenses: Not on file  Food Insecurity:   . Worried About Charity fundraiser in the Last Year: Not on file  . Ran Out of Food in the Last Year: Not on file  Transportation Needs:   . Lack of Transportation (Medical): Not on file  . Lack of Transportation (Non-Medical): Not on file  Physical Activity:   . Days of Exercise per Week: Not on file  . Minutes of Exercise per Session: Not on file  Stress:   . Feeling of Stress : Not on file  Social Connections:   . Frequency of Communication with Friends and Family: Not on file  . Frequency of Social Gatherings with Friends and Family:  Not on file  . Attends Religious Services: Not on file  . Active Member of Clubs or Organizations: Not on file  . Attends Archivist Meetings: Not on file  . Marital Status: Not on file  Intimate Partner Violence:   . Fear of Current or Ex-Partner: Not on file  . Emotionally Abused: Not on file  . Physically Abused: Not on file  . Sexually Abused: Not on file      Family History  Problem Relation Age of Onset  . Hyperlipidemia Mother   . Hypertension Mother   . Heart attack Mother  May 2017, smoker  . GI Bleed Mother        Aug. 2017  . Diverticulitis Mother        Aug. 2017  . Irritable bowel syndrome Mother   . Hypertension Father   . Colon polyps Father   . Hypertension Sister   . Heart disease Maternal Grandmother   . Diabetes Mellitus I Maternal Grandmother   . Heart attack Paternal Grandfather   . Colon cancer Other        mat great uncle, dx in 47's  . Breast cancer Maternal Aunt 64  . Lung cancer Maternal Aunt 29  . Cancer Sister 3       eye tumor/cancer- unk the name  . Other Sister        Brain tumors  . Esophageal cancer Neg Hx   . Stomach cancer Neg Hx   . Rectal cancer Neg Hx   . Sudden Cardiac Death Neg Hx     Vitals:   2020-01-01 1133  BP: 140/70  Pulse: 76  SpO2: 98%  Weight: 92.3 kg (203 lb 6.4 oz)    PHYSICAL EXAM: General:  Well appearing. No respiratory difficulty HEENT: normal Neck: supple. no JVD. Carotids 2+ bilat; no bruits. No lymphadenopathy or thryomegaly appreciated. Cor: PMI nondisplaced. Regular rate & rhythm. 2/6 SEM increase with valsalva Lungs: clear Abdomen: obese soft, nontender, nondistended. No hepatosplenomegaly. No bruits or masses. Good bowel sounds. Extremities: no cyanosis, clubbing, rash, edema Neuro: alert & oriented x 3, cranial nerves grossly intact. moves all 4 extremities w/o difficulty. Affect pleasant.  ECG: NSR 64 No ST-T wave abnormalities. Personally reviewed   ASSESSMENT & PLAN:  1.  HOCM (hypertrophic obstructive cardiomyopathy) (HCC) - echo, cMRI, ETT and Zio monitoring all without high-risk features  - Has high LVOT gradient (~135mmHG) but essentially asymptomatic - Long discussion about pathogenesis, natural history and risk stratification associated with HOCM - Start metoprolol 25 bid - Await genetic counseling results - Will screen 1st degree relatives with echos - No indication for ICD currently - Avoid high-intensity activities. Moderate exercise ok.  - Refer to Dr. Mina Marble at [facility]  2. Essential hypertension - Blood pressure well controlled. Continue current regimen. - Consider stopping HCTZ to avoid volume depletion  Glori Bickers, MD  8:17 PM

## 2019-12-19 ENCOUNTER — Telehealth (HOSPITAL_COMMUNITY): Payer: Self-pay

## 2019-12-19 NOTE — Telephone Encounter (Signed)
Agree with plan 

## 2019-12-19 NOTE — Telephone Encounter (Signed)
Pt called requesting medical records be sent to [facility] ( Dr. Mina Marble). Pt also states that her heart rate has been low since starting her metoprolol yesterday. Reports heart rate between 43-50bpm via her apple watch. Pt reports bp to be 130/78. Having pt reduce metoprolol to 12.5mg  bid and not to take it if HR is lower than 60bpm. Will follow up with pt. Pt verbalized understanding and is agreeable.

## 2019-12-22 ENCOUNTER — Ambulatory Visit
Admission: RE | Admit: 2019-12-22 | Discharge: 2019-12-22 | Disposition: A | Discharge status: Home / Self Care | Payer: BC Managed Care – PPO | Source: Ambulatory Visit | Attending: Family Medicine | Admitting: Family Medicine

## 2019-12-22 ENCOUNTER — Other Ambulatory Visit: Payer: Self-pay

## 2019-12-22 DIAGNOSIS — Z1231 Encounter for screening mammogram for malignant neoplasm of breast: Secondary | ICD-10-CM

## 2019-12-23 ENCOUNTER — Telehealth: Payer: Self-pay | Admitting: Interventional Cardiology

## 2019-12-23 NOTE — Telephone Encounter (Signed)
Pt said she was told to call our office and let us know her heart rate over the weekend. Pt says she has not taken metoprolol since Friday because her heart rate has been in the low 50's and she was advised not to take metoprolol if rate was less than 60. She would like to know how to take metoprolol (if at all) moving forward.  Routed to [place]

## 2019-12-23 NOTE — Telephone Encounter (Signed)
Dr. Derinda Sis from [facility] is requesting the patient's medical records be faxed to their office. Including all test and blood work performed. The fax number is  [phone].

## 2019-12-24 NOTE — Telephone Encounter (Signed)
Patient aware and voiced understanding

## 2019-12-24 NOTE — Telephone Encounter (Signed)
No need to take if HR that low.

## 2020-01-01 ENCOUNTER — Other Ambulatory Visit: Payer: Self-pay

## 2020-01-01 ENCOUNTER — Ambulatory Visit: Payer: BC Managed Care – PPO | Admitting: Genetic Counselor

## 2020-01-08 NOTE — Progress Notes (Signed)
Pre Test GC  Referring Provider: Richardson Dopp, PA-C   Referral Reason  [name] was referred for genetic consult and testing of hypertrophic cardiomyopathy after a recent cardiac MRI detected severe asymmetric septal hypertrophy with left ventricular outflow tract obstruction.  Genetic Consultation Notes  [name] was counseled on the genetics of hypertrophic cardiomyopathy (HCM), namely its autosomal dominant inheritance. We also talked about incomplete penetrance, variable expression and digenic/compound mutations that can be seen in some patients with HCM. We briefly discussed the inheritance pattern and treatment plans for the infiltrative cardiomyopathies that present as HCM phenocopies.   We walked through the process of genetic testing. I explained to her that there are three possible outcomes of genetic testing; namely positive, negative and variant of unknown significance. A positive outcome can be expected in about 70%-75% of HCM cases that do not have risk factors for HCM, present early in life with increased severity and have a family history of sudden cardiac death and/or a relative that has been diagnosed with HCM. A negative test does not exclude a genetic basis for HCM. Limitations in current genetic testing methodology can produce a negative result. Variants of unknown significance (VUS) are typically observed in about 10%-15% of cases. However, this number can increase if genetic testing is performed by a laboratory that employs extremely large gene panels of 9 or more sarcomeric genes. I explained to her that typically a VUS is so classified if the variant is not well understood as very few individuals have been reported to harbor this variant or its role in gene function has not been elucidated. The potential outcomes of genetic testing and subsequent management of at-risk family members were discussed so as to manage expectations.   Her medical and 4-generation family history  was obtained. See details below-  [name] (III.2 on pedigree) is a pleasant 49 year-old Caucasian lady. She was found to have a heart murmur at her annual physical in 2020. At her next physical her PCP noted that the murmur sounded louder and referred her for cardiac imaging studies. She was found to have asymmetric septal hypertrophy (basal IVS: 2.1cm) with SAM. She reports some chest discomfort and rapid heart beats but denies having dizziness, syncope or shortness of breath.  Traditional Risk Factors [name] was diagnosed with hypertension at 80 at her first pregnancy. She reports that it is well controlled by medication.   Family history [name] (III.4) has two children; a 41 year-old son (IV.74) and a 86 year-old daughter (IV.5). Her daughter has hypertension and recently had a normal echocardiogram. Her son is in good health and has not yet been screened for HCM. [name] has an older sister (III.1), age 70 who is in good health and also has not been screened for HCM.   [name]'s father (II.6) was found to have some rhythm abnormalities about 20 years ago that have since resolved. He had a normal stress test 3 years ago. His siblings have passed away- 29 (II.10II.3) in a housefire as children, along with their mother (I.2). One sister (II.4) succumbed to brain tumor at age 40, the other died of cancer at 37 (II.5). [name]'s paternal grandfather was diagnosed with CHF at age 4 and died two years later. She reports that he was an alcoholic from age 75 to 59.   [name]'s mother (II.7) had a heart attack at age 71 and had a normal echocardiogram screen at that time. She has three living sisters (II.8, II.10, II.11) who do not  have major heart issues. Her brother (II.10) had a cardiac bypass at age 70. A brother was killed in a car accident at age 70 (II.59) and another brother (II.14) died of heart issues at age 70. She tells me that he had a cardiac bypass in his 68s and  later had a defibrillator. Her maternal grandmother (I.4) died at 76 from a stroke and maternal grandfather (I.3) died at 108 from peptic ulcers.  Impression  [name] is symptomatic and diagnosed with HCM at age 36. There is no significant family history of HCM or sudden death amongst her first-degree relatives. Her early age of presentation and severity of presentation is indicative of a genetic condition. Genetic testing for HCM is recommended. This test should include the major genes for HCM as well as the HCM phenocopies of Fabry disease, Danon disease, Wolf-Parkinson White syndrome and FTA. Genetic testing will confirm if she has a sarcomeric gene mutation or a HCM phenocopy.  In addition, we discussed the protections afforded by the Genetic Information Non-Discrimination Act (GINA). I explained to her that GINA protects her from losing her employment or health insurance based on her genotype. However, these protections do not cover life insurance and disability. She verbalized understanding of this.  Please note that the patient has not been counseled in this visit on personal, cultural or ethical issues that she may face due to her heart condition.   Plan [name] would like to pursue genetic testing for HCM. Blood was drawn today for testing.   [name], Ph.D, [facility] Clinical Molecular Geneticist

## 2020-01-12 ENCOUNTER — Other Ambulatory Visit: Payer: Self-pay | Admitting: Physician Assistant

## 2020-01-12 DIAGNOSIS — I421 Obstructive hypertrophic cardiomyopathy: Secondary | ICD-10-CM

## 2020-01-14 ENCOUNTER — Ambulatory Visit: Payer: BC Managed Care – PPO | Admitting: Psychiatry

## 2020-01-15 ENCOUNTER — Encounter: Payer: Self-pay | Admitting: Psychiatry

## 2020-01-15 ENCOUNTER — Ambulatory Visit (INDEPENDENT_AMBULATORY_CARE_PROVIDER_SITE_OTHER): Payer: BC Managed Care – PPO | Admitting: Psychiatry

## 2020-01-15 ENCOUNTER — Other Ambulatory Visit: Payer: Self-pay

## 2020-01-15 DIAGNOSIS — F410 Panic disorder [episodic paroxysmal anxiety] without agoraphobia: Secondary | ICD-10-CM

## 2020-01-15 DIAGNOSIS — F411 Generalized anxiety disorder: Secondary | ICD-10-CM

## 2020-01-15 NOTE — Progress Notes (Signed)
Cola Grainer [id number] 1971/09/25 49 y.o.  Subjective:   Patient ID:  [name] is a 49 y.o. (DOB September 20, 1971) female.  Chief Complaint:  Chief Complaint  Patient presents with  . Anxiety    Med management  . Follow-up    Change in meds    HPI [name] presents to the office today for follow-up of generalized anxiety disorder and panic disorder without agoraphobia.  She was first seen on May 18, 2020.  At that time she was taking sertraline 150 mg daily for 2 months without much change.  She was also taking a low-dose of clonazepam.   She agreed to cross taper to paroxetine 40 mg and to give it at least 8 weeks at that dose to get improved response.  Typically paroxetine is more effective for panic than is Lexapro she had taken in the past with buspirone.  Also just just suggested increasing buspirone to 30 mg twice daily to maximize response.  Hard to tell.  Initially OK but last 3 weeks needed daily Klonopin daily.  Anxiety still wakes her up every morning.  Some nausea in AM.  Can't eat first thing.  Better once at work at school.  Arms can feel heavy and tingly and Klonopin helps. Newly dx heart condition but doesn't feel freaked out over that. Tried metoprolol but not tolerated.   Patient reports stable mood and denies depressed or irritable moods.    Patient denies difficulty with sleep initiation or maintenance. Denies appetite disturbance.  Patient reports that energy and motivation have been good.  Patient denies any difficulty with concentration.  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  Past Psychiatric Medication Trials: Sertraline 150 no response Lexapro 30 with buspirone 20 twice daily with some response Low-dose clonazepam  Review of Systems:  Review of Systems  Respiratory: Negative for shortness of breath.   Cardiovascular: Positive for palpitations.  Neurological: Negative for tremors and weakness.    Medications: I have  reviewed the patient's current medications.  Current Outpatient Medications  Medication Sig Dispense Refill  . busPIRone (BUSPAR) 30 MG tablet Take 1 tablet (30 mg total) by mouth 2 (two) times daily. 180 tablet 0  . clonazePAM (KLONOPIN) 0.5 MG tablet Take 0.5 mg by mouth daily as needed.    Marland Kitchen losartan-hydrochlorothiazide (HYZAAR) 50-12.5 MG tablet Take 1 tablet by mouth daily.    Marland Kitchen PARoxetine (PAXIL) 20 MG tablet Take 2 tablets (40 mg total) by mouth daily. 180 tablet 0  . pravastatin (PRAVACHOL) 40 MG tablet Take 1 tablet by mouth daily.     Current Facility-Administered Medications  Medication Dose Route Frequency Provider Last Rate Last Admin  . 0.9 %  sodium chloride infusion  500 mL Intravenous Continuous Nandigam, Kavitha V, MD      . 0.9 %  sodium chloride infusion  500 mL Intravenous Once Nandigam, Venia Minks, MD        Medication Side Effects: None  Allergies:  Allergies  Allergen Reactions  . Ciprofloxacin Rash    Past Medical History:  Diagnosis Date  . Anxiety   . Family history of breast cancer   . GERD (gastroesophageal reflux disease)   . Heart murmur   . History of colonic polyps 05/27/2018  . Hyperlipidemia   . Hypertension   . 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 Panic disorder     Family History  Problem Relation Age of Onset  . Hyperlipidemia Mother   . Hypertension Mother   . Heart attack Mother        May 2017, smoker  . GI Bleed Mother        Aug. 2017  . Diverticulitis Mother        Aug. 2017  . Irritable bowel syndrome Mother   . Hypertension Father   . Colon polyps Father   . Hypertension Sister   . Heart disease Maternal Grandmother   . Diabetes Mellitus I  Maternal Grandmother   . Heart attack Paternal Grandfather   . Colon cancer Other        mat great uncle, dx in 92's  . Breast cancer Maternal Aunt 72  . Lung cancer Maternal Aunt 41  . Cancer Sister 3       eye tumor/cancer- unk the name  . Other Sister        Brain tumors  . Esophageal cancer Neg Hx   . Stomach cancer Neg Hx   . Rectal cancer Neg Hx   . Sudden Cardiac Death Neg Hx     Social History   Socioeconomic History  . Marital status: Married    Spouse name: Not on file  . Number of children: 2  . Years of education: Not on file  . Highest education level: Not on file  Occupational History  . Occupation: Pharmacist, hospital   Tobacco Use  . Smoking status: Never Smoker  . Smokeless tobacco: Never Used  Substance and Sexual Activity  . Alcohol use: No  . Drug use: No  . Sexual activity: Not on file  Other Topics Concern  . Not on file  Social History Narrative  . Not on file   Social Determinants of Health   Financial Resource Strain:   . Difficulty of Paying Living Expenses:   Food Insecurity:   . Worried About Charity fundraiser in the Last Year:   . Arboriculturist in the Last Year:   Transportation Needs:   . Film/video editor (Medical):   Marland Kitchen Lack of Transportation (Non-Medical):   Physical Activity:   . Days of Exercise per Week:   . Minutes of Exercise per Session:   Stress:   . Feeling of Stress :   Social Connections:   . Frequency of Communication with Friends and Family:   . Frequency of Social Gatherings with Friends and Family:   . Attends Religious Services:   . Active Member of Clubs or Organizations:   . Attends Archivist Meetings:   Marland Kitchen Marital Status:   Intimate Partner Violence:   . Fear of Current or Ex-Partner:   . Emotionally Abused:   Marland Kitchen Physically Abused:   . Sexually Abused:     Past Medical History, Surgical history, Social history, and Family history were reviewed and updated as appropriate.   Please see review of  systems for further details on the patient's review from today.   Objective:   Physical Exam:  There were no vitals taken for this visit.  Physical Exam Constitutional:      General: She is not in acute distress. Musculoskeletal:        General: No deformity.  Neurological:     Mental Status: She is alert and oriented to person,  place, and time.     Coordination: Coordination normal.  Psychiatric:        Attention and Perception: Attention and perception normal. She does not perceive auditory or visual hallucinations.        Mood and Affect: Mood is anxious. Mood is not depressed. Affect is not labile, blunt, angry or inappropriate.        Speech: Speech normal.        Behavior: Behavior normal.        Thought Content: Thought content normal. Thought content is not paranoid or delusional. Thought content does not include homicidal or suicidal ideation. Thought content does not include homicidal or suicidal plan.        Cognition and Memory: Cognition and memory normal.        Judgment: Judgment normal.     Comments: Insight intact Somatic anxiety     Lab Review:     Component Value Date/Time   NA 139 10/07/2019 0903   K 3.9 10/07/2019 0903   CL 100 10/07/2019 0903   CO2 27 10/07/2019 0903   GLUCOSE 104 (H) 10/07/2019 0903   GLUCOSE 105 (H) 07/12/2018 1417   BUN 13 10/07/2019 0903   CREATININE 0.92 10/07/2019 0903   CALCIUM 9.6 10/07/2019 0903   PROT 6.3 10/07/2019 0903   ALBUMIN 4.1 10/07/2019 0903   AST 17 10/07/2019 0903   ALT 10 10/07/2019 0903   ALKPHOS 42 10/07/2019 0903   BILITOT 0.8 10/07/2019 0903   GFRNONAA 74 10/07/2019 0903   GFRAA 85 10/07/2019 0903       Component Value Date/Time   WBC 7.4 07/12/2018 1417   RBC 4.64 07/12/2018 1417   HGB 13.3 07/12/2018 1417   HCT 39.6 07/12/2018 1417   PLT 184 07/12/2018 1417   MCV 85.3 07/12/2018 1417   MCH 28.7 07/12/2018 1417   MCHC 33.6 07/12/2018 1417   RDW 12.5 07/12/2018 1417    No results found for:  POCLITH, LITHIUM   No results found for: PHENYTOIN, PHENOBARB, VALPROATE, CBMZ   .res Assessment: Plan:    [name] was seen today for anxiety and follow-up.  Diagnoses and all orders for this visit:  Generalized anxiety disorder  Panic disorder without agoraphobia    NR anxiety with paroxetine 40 for 6-7 weeks plus buspirone 30 BID.  Tolerated. Increase paroxetine 60 mg daily. Disc SE  Disc SE in detail and SSRI withdrawal sx.  Schedule Klonopin 0.25 mg BID bc morning anxiety bc awakening with anxiety. We discussed the short-term risks associated with benzodiazepines including sedation and increased fall risk among others.  Discussed long-term side effect risk including dependence, potential withdrawal symptoms, and the potential eventual dose-related risk of dementia.  But recent studies from 2020 dispute this association between benzodiazepines and dementia risk. Newer studies in 2020 do not support an association with dementia.  Once anxiety if better then wean buspirone bc NR over a week.  FU 6 week  Lynder Parents, MD, DFAPA '  Please see After Visit Summary for patient specific instructions.  Future Appointments  Date Time Provider [place]  03/15/2020  3:00 PM Belva Crome, MD [facility]    No orders of the defined types were placed in this encounter.   -------------------------------

## 2020-02-07 ENCOUNTER — Other Ambulatory Visit: Payer: Self-pay | Admitting: Psychiatry

## 2020-02-07 DIAGNOSIS — F410 Panic disorder [episodic paroxysmal anxiety] without agoraphobia: Secondary | ICD-10-CM

## 2020-02-07 DIAGNOSIS — F411 Generalized anxiety disorder: Secondary | ICD-10-CM

## 2020-02-19 ENCOUNTER — Encounter: Payer: Self-pay | Admitting: Psychiatry

## 2020-02-19 ENCOUNTER — Ambulatory Visit (INDEPENDENT_AMBULATORY_CARE_PROVIDER_SITE_OTHER): Payer: BC Managed Care – PPO | Admitting: Psychiatry

## 2020-02-19 ENCOUNTER — Other Ambulatory Visit: Payer: Self-pay

## 2020-02-19 DIAGNOSIS — F410 Panic disorder [episodic paroxysmal anxiety] without agoraphobia: Secondary | ICD-10-CM | POA: Diagnosis not present

## 2020-02-19 DIAGNOSIS — F411 Generalized anxiety disorder: Secondary | ICD-10-CM

## 2020-02-19 MED ORDER — CLONAZEPAM 0.5 MG PO TABS: 0.5000 mg | ORAL_TABLET | Freq: Two times a day (BID) | ORAL | 1 refills | Status: DC | PRN

## 2020-02-19 NOTE — Progress Notes (Signed)
[name] [id number] [birth year] 49 y.o.  Subjective:   Patient ID:  [name] is a 49 y.o. (DOB 04-30-1971) female.  Chief Complaint:  Chief Complaint  Patient presents with  . Follow-up    Med changes  . Anxiety    HPI [name] presents to the office today for follow-up of generalized anxiety disorder and panic disorder without agoraphobia.  Anxiety worse since January 2020.  She was first seen on May 18, 2020.  At that time she was taking sertraline 150 mg daily for 2 months without much change.  She was also taking a low-dose of clonazepam.   She agreed to cross taper to paroxetine 40 mg and to give it at least 8 weeks at that dose to get improved response.  Typically paroxetine is more effective for panic than is Lexapro she had taken in the past with buspirone.  Also just just suggested increasing buspirone to 30 mg twice daily to maximize response.  Last seen January 15, 2020.  Following was noted: NR anxiety with paroxetine 40 for 6-7 weeks plus buspirone 30 BID.  Tolerated. Increase paroxetine 60 mg daily. Schedule Klonopin 0.25 mg BID bc morning anxiety bc awakening with anxiety.  February 19, 1999 2100 appointment, the following is noted: Increased paroxetine 60 mg daily and Klonopin 0.5 mg daily since April 10. OK, but not as well as I want to.  Still needs the clonazepam.    Maybe some benefit from the increase paroxetine.   Crying spells for no reason lately.  Frustrated.  Still gets rushes of anxity with numbness tingling, butterflies in the stomach.  Eating is fine. But still anxiety is worse in the morning.  Less waking from anxiety.  Better once at work at school.  Arms can feel heavy and tingly and Klonopin helps. Newly dx heart condition but doesn't feel freaked out over that.  Patient reports stable mood and denies depressed or irritable moods.    Patient denies difficulty with sleep initiation or maintenance. Denies appetite  disturbance.  Patient reports that energy and motivation have been good.  Patient denies any difficulty with concentration.  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  Past Psychiatric Medication Trials: Sertraline 150 no response Lexapro 30 with buspirone 20 twice daily with some response Buspirone 30 BID Low-dose clonazepam  Review of Systems:  Review of Systems  Respiratory: Negative for shortness of breath.   Cardiovascular: Positive for palpitations.  Neurological: Negative for dizziness, tremors and weakness.    Medications: I have reviewed the patient's current medications.  Current Outpatient Medications  Medication Sig Dispense Refill  . atenolol (TENORMIN) 25 MG tablet Take 25 mg by mouth daily.    Marland Kitchen losartan (COZAAR) 25 MG tablet Take 50 mg by mouth daily.    Marland Kitchen PARoxetine (PAXIL) 20 MG tablet TAKE 2 TABLETS BY MOUTH EVERY DAY (Patient taking differently: Take 60 mg by mouth daily. ) 180 tablet 0  . pravastatin (PRAVACHOL) 40 MG tablet Take 1 tablet by mouth daily.    . clonazePAM (KLONOPIN) 0.5 MG tablet Take 1 tablet (0.5 mg total) by mouth 2 (two) times daily as needed for anxiety. 60 tablet 1   Current Facility-Administered Medications  Medication Dose Route Frequency Provider Last Rate Last Admin  . 0.9 %  sodium chloride infusion  500 mL Intravenous Continuous Nandigam, Kavitha V, MD      . 0.9 %  sodium chloride infusion  500 mL Intravenous Once Nandigam, Venia Minks, MD  Medication Side Effects: None  Allergies:  Allergies  Allergen Reactions  . Ciprofloxacin Rash    Past Medical History:  Diagnosis Date  . Anxiety   . Family history of breast cancer   . GERD (gastroesophageal reflux disease)   . Heart murmur   . History of colonic polyps 05/27/2018  . Hyperlipidemia   . Hypertension   . 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 Panic disorder     Family History  Problem Relation Age of Onset  . Hyperlipidemia Mother   . Hypertension Mother   . Heart attack Mother        May 2017, smoker  . GI Bleed Mother        Aug. 2017  . Diverticulitis Mother        Aug. 2017  . Irritable bowel syndrome Mother   . Hypertension Father   . Colon polyps Father   . Hypertension Sister   . Heart disease Maternal Grandmother   . Diabetes Mellitus I Maternal Grandmother   . Heart attack Paternal Grandfather   . Colon cancer Other        mat great uncle, dx in 70's  . Breast cancer Maternal Aunt 68  . Lung cancer Maternal Aunt 61  . Cancer Sister 3       eye tumor/cancer- unk the name  . Other Sister        Brain tumors  . Esophageal cancer Neg Hx   . Stomach cancer Neg Hx   . Rectal cancer Neg Hx   . Sudden Cardiac Death Neg Hx     Social History   Socioeconomic History  . Marital status: Married    Spouse name: Not on file  . Number of children: 2  . Years of education: Not on file  . Highest education level: Not on file  Occupational History  . Occupation: Pharmacist, hospital   Tobacco Use  . Smoking status: Never Smoker  . Smokeless tobacco: Never Used  Substance and Sexual Activity  . Alcohol use: No  . Drug use: No  . Sexual activity: Not on file  Other Topics Concern  . Not on file  Social History Narrative  . Not on file   Social Determinants of Health   Financial Resource Strain:   . Difficulty of Paying Living Expenses:   Food Insecurity:   . Worried About Charity fundraiser in the Last Year:   . Arboriculturist in the Last Year:   Transportation Needs:   . Film/video editor (Medical):   Marland Kitchen Lack of Transportation (Non-Medical):   Physical Activity:   . Days of Exercise per Week:   . Minutes of Exercise per Session:   Stress:   .  Feeling of Stress :   Social Connections:   . Frequency of Communication with Friends and Family:   . Frequency of Social Gatherings with Friends and Family:   . Attends Religious Services:   . Active Member of Clubs or Organizations:   . Attends Archivist Meetings:   Marland Kitchen Marital Status:   Intimate Partner Violence:   . Fear of Current or Ex-Partner:   . Emotionally Abused:   Marland Kitchen Physically Abused:   .  Sexually Abused:     Past Medical History, Surgical history, Social history, and Family history were reviewed and updated as appropriate.   Please see review of systems for further details on the patient's review from today.   Objective:   Physical Exam:  There were no vitals taken for this visit.  Physical Exam Constitutional:      General: She is not in acute distress. Musculoskeletal:        General: No deformity.  Neurological:     Mental Status: She is alert and oriented to person, place, and time.     Coordination: Coordination normal.  Psychiatric:        Attention and Perception: Attention and perception normal. She does not perceive auditory or visual hallucinations.        Mood and Affect: Mood is anxious. Mood is not depressed. Affect is not labile, blunt, angry or inappropriate.        Speech: Speech normal.        Behavior: Behavior normal.        Thought Content: Thought content normal. Thought content is not paranoid or delusional. Thought content does not include homicidal or suicidal ideation. Thought content does not include homicidal or suicidal plan.        Cognition and Memory: Cognition and memory normal.        Judgment: Judgment normal.     Comments: Insight intact Somatic anxiety ongoing     Lab Review:     Component Value Date/Time   NA 139 10/07/2019 0903   K 3.9 10/07/2019 0903   CL 100 10/07/2019 0903   CO2 27 10/07/2019 0903   GLUCOSE 104 (H) 10/07/2019 0903   GLUCOSE 105 (H) 07/12/2018 1417   BUN 13 10/07/2019 0903    CREATININE 0.92 10/07/2019 0903   CALCIUM 9.6 10/07/2019 0903   PROT 6.3 10/07/2019 0903   ALBUMIN 4.1 10/07/2019 0903   AST 17 10/07/2019 0903   ALT 10 10/07/2019 0903   ALKPHOS 42 10/07/2019 0903   BILITOT 0.8 10/07/2019 0903   GFRNONAA 74 10/07/2019 0903   GFRAA 85 10/07/2019 0903       Component Value Date/Time   WBC 7.4 07/12/2018 1417   RBC 4.64 07/12/2018 1417   HGB 13.3 07/12/2018 1417   HCT 39.6 07/12/2018 1417   PLT 184 07/12/2018 1417   MCV 85.3 07/12/2018 1417   MCH 28.7 07/12/2018 1417   MCHC 33.6 07/12/2018 1417   RDW 12.5 07/12/2018 1417    No results found for: POCLITH, LITHIUM   No results found for: PHENYTOIN, PHENOBARB, VALPROATE, CBMZ   .res Assessment: Plan:    [name] was seen today for follow-up and anxiety.  Diagnoses and all orders for this visit:  Generalized anxiety disorder -     clonazePAM (KLONOPIN) 0.5 MG tablet; Take 1 tablet (0.5 mg total) by mouth 2 (two) times daily as needed for anxiety.  Panic disorder without agoraphobia -     clonazePAM (KLONOPIN) 0.5 MG tablet; Take 1 tablet (0.5 mg total) by mouth 2 (two) times daily as needed for anxiety.    NR anxiety with paroxetine 40 for 6-7 weeks plus buspirone 30 BID.  Tolerated. Continue longer paroxetine 60 mg daily. Disc SE  Disc SE in detail and SSRI withdrawal sx. Option Abilify potentiation.   Discussed at length the typical pattern of improvement seen with SSRIs as being gradual over 2 or even 3 months.  This needs more time.  Schedule Klonopin 0.25 mg  BID bc morning anxiety bc awakening with anxiety. We discussed the short-term risks associated with benzodiazepines including sedation and increased fall risk among others.  Discussed long-term side effect risk including dependence, potential withdrawal symptoms, and the potential eventual dose-related risk of dementia.  But recent studies from 2020 dispute this association between benzodiazepines and dementia risk. Newer studies  in 2020 do not support an association with dementia.  Stop buspirone DT limited benefit.  FU 6 week  Lynder Parents, MD, DFAPA '  Please see After Visit Summary for patient specific instructions.  Future Appointments  Date Time Provider [place]  03/15/2020  3:00 PM Belva Crome, MD [facility]    No orders of the defined types were placed in this encounter.   -------------------------------

## 2020-03-15 ENCOUNTER — Other Ambulatory Visit: Payer: Self-pay

## 2020-03-15 ENCOUNTER — Encounter: Payer: Self-pay | Admitting: Interventional Cardiology

## 2020-03-15 ENCOUNTER — Ambulatory Visit: Payer: BC Managed Care – PPO | Admitting: Interventional Cardiology

## 2020-03-15 VITALS — BP 138/78 | HR 58 | Ht 67.0 in | Wt 205.6 lb

## 2020-03-15 DIAGNOSIS — E785 Hyperlipidemia, unspecified: Secondary | ICD-10-CM

## 2020-03-15 DIAGNOSIS — I10 Essential (primary) hypertension: Secondary | ICD-10-CM

## 2020-03-15 DIAGNOSIS — I421 Obstructive hypertrophic cardiomyopathy: Secondary | ICD-10-CM

## 2020-03-15 DIAGNOSIS — Z7189 Other specified counseling: Secondary | ICD-10-CM | POA: Diagnosis not present

## 2020-03-15 DIAGNOSIS — R5383 Other fatigue: Secondary | ICD-10-CM

## 2020-03-15 DIAGNOSIS — R400 Somnolence: Secondary | ICD-10-CM

## 2020-03-15 NOTE — Progress Notes (Signed)
Cardiology Office Note:    Date:  03/15/2020   ID:  [name], DOB July 16, 1971, MRN [id number]  PCP:  Marda Stalker, PA-C  Cardiologist:  Sinclair Grooms, MD   Referring MD: Marda Stalker, PA-C   No chief complaint on file.   History of Present Illness:    [name] is a 49 y.o. female with a hx of hypertrophic cardiomyopathy, systolic murmur, and work-up that has not demonstrated high risk features including relatively benign cardiac MRI, cardiac monitor, and exercise treadmill test.  She does have LV outflow tract gradient of 120 mmHg by echo.  She has seen Dr. Beatris Si in the [place] [facility] clinic and also Dr. Derinda Sis at [facility].  She had genetic counseling however I am unable to identify that genetic testing results have returned.  She is seen Dr. [name] (genetics), Dr. Glori Bickers, and Dr. Derinda Sis all related to hypertrophic obstructive cardiomyopathy.  Medication adjustments were made.  She was unable to tolerate metoprolol.  Dr. Mina Marble started atenolol 25 mg/day which she is tolerating much better.  He also decrease the dose of angiotensin receptor blocker losartan.  She has occasional palpitation but frequently checks her cardia app and is not in atrial fibrillation or having significant increase in heart rate.  She has not had syncope, angina, orthopnea, or PND.  Past Medical History:  Diagnosis Date  . Anxiety   . Family history of breast cancer   . GERD (gastroesophageal reflux disease)   . Heart murmur   . History of colonic polyps 05/27/2018  . Hyperlipidemia   . Hypertension   . Hypertrophic obstructive cardiomyopathy 10/2019   Echocardiogram 10/2019: LVOT 120 mmHg // cMRI 10/2019: basal septum 21 mm, + SAM, no high risk features (no LGE; T2, ECV normal) // ETT 10/2019: no ischemic ST changes, normal BP response, no exercise induced VT // Cardiac monitor 11/2019: no ventricular arrhythmias    . Panic disorder     Past Surgical History:  Procedure Laterality Date  . COLONOSCOPY    . DILATION AND CURETTAGE OF UTERUS     for miscarriage  . POLYPECTOMY      Current Medications: No outpatient medications have been marked as taking for the 03/15/20 encounter (Appointment) with Belva Crome, MD.   Current Facility-Administered Medications for the 03/15/20 encounter (Appointment) with Belva Crome, MD  Medication  . 0.9 %  sodium chloride infusion  . 0.9 %  sodium chloride infusion     Allergies:   Ciprofloxacin   Social History   Socioeconomic History  . Marital status: Married    Spouse name: Not on file  . Number of children: 2  . Years of education: Not on file  . Highest education level: Not on file  Occupational History  . Occupation: Pharmacist, hospital   Tobacco Use  . Smoking status: Never Smoker  . Smokeless tobacco: Never Used  Substance and Sexual Activity  . Alcohol use: No  . Drug use: No  . Sexual activity: Not on file  Other Topics Concern  . Not on file  Social History Narrative  . Not on file   Social Determinants of Health   Financial Resource Strain:   . Difficulty of Paying Living Expenses:   Food Insecurity:   . Worried About Charity fundraiser in the Last Year:   . Arboriculturist in the Last Year:   Transportation Needs:   . Lack  of Transportation (Medical):   Marland Kitchen Lack of Transportation (Non-Medical):   Physical Activity:   . Days of Exercise per Week:   . Minutes of Exercise per Session:   Stress:   . Feeling of Stress :   Social Connections:   . Frequency of Communication with Friends and Family:   . Frequency of Social Gatherings with Friends and Family:   . Attends Religious Services:   . Active Member of Clubs or Organizations:   . Attends Archivist Meetings:   Marland Kitchen Marital Status:      Family History: The patient's family history includes Breast cancer (age of onset: 73) in her maternal aunt; Cancer (age of onset:  71) in her sister; Colon cancer in an other family member; Colon polyps in her father; Diabetes Mellitus I in her maternal grandmother; Diverticulitis in her mother; GI Bleed in her mother; Heart attack in her mother and paternal grandfather; Heart disease in her maternal grandmother; Hyperlipidemia in her mother; Hypertension in her father, mother, and sister; Irritable bowel syndrome in her mother; Lung cancer (age of onset: 27) in her maternal aunt; Other in her sister. There is no history of Esophageal cancer, Stomach cancer, Rectal cancer, or Sudden Cardiac Death.  ROS:   Please see the history of present illness.    Anxiety concerning her process and lack of knowledge concerns her.  All other systems reviewed and are negative.  EKGs/Labs/Other Studies Reviewed:    The following studies were reviewed today: No new imaging data.  EKG:  EKG EKG is not repeated  Recent Labs: 10/07/2019: ALT 10; BUN 13; Creatinine, Ser 0.92; Potassium 3.9; Sodium 139  Recent Lipid Panel    Component Value Date/Time   CHOL 201 (H) 10/07/2019 0903   TRIG 76 10/07/2019 0903   HDL 77 10/07/2019 0903   CHOLHDL 2.6 10/07/2019 0903   LDLCALC 110 (H) 10/07/2019 0903    Physical Exam:    VS:  There were no vitals taken for this visit.    Wt Readings from Last 3 Encounters:  12/18/19 203 lb 6.4 oz (92.3 kg)  11/12/19 205 lb (93 kg)  09/29/19 214 lb (97.1 kg)     GEN: Moderate obesity. No acute distress HEENT: Normal NECK: No JVD. LYMPHATICS: No lymphadenopathy CARDIAC: 2/6 systolic murmur.  RRR without diastolic murmur, gallop, or edema. VASCULAR:  Normal Pulses. No bruits. RESPIRATORY:  Clear to auscultation without rales, wheezing or rhonchi  ABDOMEN: Soft, non-tender, non-distended, No pulsatile mass, MUSCULOSKELETAL: No deformity  SKIN: Warm and dry NEUROLOGIC:  Alert and oriented x 3 PSYCHIATRIC:  Normal affect   ASSESSMENT:    1. HOCM (hypertrophic obstructive cardiomyopathy) ([place])   2.  Essential hypertension   3. Hyperlipidemia with target LDL less than 100   4. Educated about COVID-19 virus infection    PLAN:    In order of problems listed above:  1. She has had significant work-up including MRI, stress test, prolonged monitoring, evaluation in the heart failure clinic, and evaluation with Dr. Mina Marble at [facility].  Genetic testing by Dr. Broadus John is still pending.  I will defer to management by Dr. Mina Marble and Dr. Haroldine Laws and see the patient as needed.  Tentatively plan for 1 year follow-up but could see earlier if acute problems. 2. Need to watch her systolic blood pressure.  A target may be to further increase Tenormin in the future and discontinue ARB therapy altogether. 3. Lipids were elevated when last evaluated with LDL of  191 in August 2020.  Pravastatin dose was intensified.  This is being followed by primary care.  Given a comorbid cardiovascular illness, LDL target should be less than 100 and preferably less than 70. 4. Covid vaccine has been received. 5. Fatigue and excessive daytime sleepiness could be related to sleep apnea.  Need to consider whether or not a sleep study would be helpful.  She will keep me posted relative to the issue  1 year as needed with me.  Consider sleep study if fatigue continues.   Medication Adjustments/Labs and Tests Ordered: Current medicines are reviewed at length with the patient today.  Concerns regarding medicines are outlined above.  No orders of the defined types were placed in this encounter.  No orders of the defined types were placed in this encounter.   There are no Patient Instructions on file for this visit.   Signed, Sinclair Grooms, MD  03/15/2020 1:17 PM    [facility]

## 2020-03-15 NOTE — Patient Instructions (Signed)

## 2020-03-25 ENCOUNTER — Other Ambulatory Visit: Payer: Self-pay

## 2020-03-25 ENCOUNTER — Ambulatory Visit (INDEPENDENT_AMBULATORY_CARE_PROVIDER_SITE_OTHER): Payer: BC Managed Care – PPO | Admitting: Psychiatry

## 2020-03-25 ENCOUNTER — Encounter: Payer: Self-pay | Admitting: Psychiatry

## 2020-03-25 DIAGNOSIS — F411 Generalized anxiety disorder: Secondary | ICD-10-CM

## 2020-03-25 DIAGNOSIS — F410 Panic disorder [episodic paroxysmal anxiety] without agoraphobia: Secondary | ICD-10-CM | POA: Diagnosis not present

## 2020-03-25 MED ORDER — PAROXETINE HCL 20 MG PO TABS: 60.0000 mg | ORAL_TABLET | Freq: Every day | ORAL | 1 refills | Status: DC

## 2020-03-25 NOTE — Progress Notes (Signed)
[name] [id number] [birth year] 49 y.o.  Subjective:   Patient ID:  [name] is a 49 y.o. (DOB 1971/01/14) female.  Chief Complaint:  Chief Complaint  Patient presents with  . Follow-up  . Anxiety    HPI [name] presents to the office today for follow-up of generalized anxiety disorder and panic disorder without agoraphobia.  Anxiety worse since January 2020.  She was first seen on May 18, 2020.  At that time she was taking sertraline 150 mg daily for 2 months without much change.  She was also taking a low-dose of clonazepam.   She agreed to cross taper to paroxetine 40 mg and to give it at least 8 weeks at that dose to get improved response.  Typically paroxetine is more effective for panic than is Lexapro she had taken in the past with buspirone.  Also just just suggested increasing buspirone to 30 mg twice daily to maximize response.  Last seen January 15, 2020.  Following was noted: NR anxiety with paroxetine 40 for 6-7 weeks plus buspirone 30 BID.  Tolerated. Increase paroxetine 60 mg daily. Schedule Klonopin 0.25 mg BID bc morning anxiety bc awakening with anxiety.  February 19, 1999 2100 appointment, the following is noted: Increased paroxetine 60 mg daily and Klonopin 0.5 mg daily since April 10. OK, but not as well as I want to.  Still needs the clonazepam.    Maybe some benefit from the increase paroxetine.   Crying spells for no reason lately.  Frustrated.  Still gets rushes of anxity with numbness tingling, butterflies in the stomach.  Eating is fine. But still anxiety is worse in the morning. Plan was to start buspirone due to limited response.  03/25/2020 appointment with the following noted: Better with less anxiety and now not having to take Klonopin daily.  Last taken it Saturday. No recent panic and anxiety is generally managed.   Tolerating paroxetine 60 mg daily.  Patient reports stable mood and denies depressed or irritable  moods.    Patient denies difficulty with sleep initiation or maintenance. Denies appetite disturbance.  Patient reports that energy and motivation have been good.  Patient denies any difficulty with concentration.  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   Cardiovascular: Negative for palpitations.  Neurological: Negative for dizziness, tremors and weakness.    Medications: I have reviewed the patient's current medications.  Current Outpatient Medications  Medication Sig Dispense Refill  . atenolol (TENORMIN) 25 MG tablet Take 25 mg by mouth daily.    . clonazePAM (KLONOPIN) 0.5 MG tablet Take 1 tablet (0.5 mg total) by mouth 2 (two) times daily as needed for anxiety. 60 tablet 1  . losartan (COZAAR) 25 MG tablet Take 25 mg by mouth daily.     Marland Kitchen PARoxetine (PAXIL) 20 MG tablet Take 3 tablets (60 mg total) by mouth daily. 270 tablet 1  . pravastatin (PRAVACHOL) 40 MG tablet Take 1 tablet by mouth daily.     Current Facility-Administered Medications  Medication Dose Route Frequency Provider Last Rate Last Admin  . 0.9 %  sodium chloride infusion  500 mL Intravenous Continuous Nandigam, Kavitha V, MD      . 0.9 %  sodium chloride infusion  500 mL Intravenous  Once Mauri Pole, MD        Medication Side Effects: None  Allergies:  Allergies  Allergen Reactions  . Ciprofloxacin Rash    Past Medical History:  Diagnosis Date  . Anxiety   . Family history of breast cancer   . GERD (gastroesophageal reflux disease)   . Heart murmur   . History of colonic polyps 05/27/2018  . Hyperlipidemia   . Hypertension   . Hypertrophic obstructive cardiomyopathy 10/2019   Echocardiogram 10/2019: LVOT 120 mmHg // cMRI  10/2019: basal septum 21 mm, + SAM, no high risk features (no LGE; T2, ECV normal) // ETT 10/2019: no ischemic ST changes, normal BP response, no exercise induced VT // Cardiac monitor 11/2019: no ventricular arrhythmias   . Panic disorder     Family History  Problem Relation Age of Onset  . Hyperlipidemia Mother   . Hypertension Mother   . Heart attack Mother        May 2017, smoker  . GI Bleed Mother        Aug. 2017  . Diverticulitis Mother        Aug. 2017  . Irritable bowel syndrome Mother   . Hypertension Father   . Colon polyps Father   . Hypertension Sister   . Heart disease Maternal Grandmother   . Diabetes Mellitus I Maternal Grandmother   . Heart attack Paternal Grandfather   . Colon cancer Other        mat great uncle, dx in 48's  . Breast cancer Maternal Aunt 59  . Lung cancer Maternal Aunt 77  . Cancer Sister 3       eye tumor/cancer- unk the name  . Other Sister        Brain tumors  . Esophageal cancer Neg Hx   . Stomach cancer Neg Hx   . Rectal cancer Neg Hx   . Sudden Cardiac Death Neg Hx     Social History   Socioeconomic History  . Marital status: Married    Spouse name: Not on file  . Number of children: 2  . Years of education: Not on file  . Highest education level: Not on file  Occupational History  . Occupation: Pharmacist, hospital   Tobacco Use  . Smoking status: Never Smoker  . Smokeless tobacco: Never Used  Substance and Sexual Activity  . Alcohol use: No  . Drug use: No  . Sexual activity: Not on file  Other Topics Concern  . Not on file  Social History Narrative  . Not on file   Social Determinants of Health   Financial Resource Strain:   . Difficulty of Paying Living Expenses:   Food Insecurity:   . Worried About Charity fundraiser in the Last Year:   . Arboriculturist in the Last Year:   Transportation Needs:   . Film/video editor (Medical):   Marland Kitchen Lack of Transportation (Non-Medical):   Physical Activity:   . Days of Exercise per  Week:   . Minutes of Exercise per Session:   Stress:   . Feeling of Stress :   Social Connections:   . Frequency of Communication with Friends and Family:   . Frequency of Social Gatherings with Friends and Family:   . Attends Religious Services:   . Active Member of Clubs or Organizations:   . Attends Archivist Meetings:   Marland Kitchen Marital Status:   Intimate Partner Violence:   . Fear of Current or  Ex-Partner:   . Emotionally Abused:   Marland Kitchen Physically Abused:   . Sexually Abused:     Past Medical History, Surgical history, Social history, and Family history were reviewed and updated as appropriate.   Please see review of systems for further details on the patient's review from today.   Objective:   Physical Exam:  There were no vitals taken for this visit.  Physical Exam Constitutional:      General: She is not in acute distress. Musculoskeletal:        General: No deformity.  Neurological:     Mental Status: She is alert and oriented to person, place, and time.     Coordination: Coordination normal.  Psychiatric:        Attention and Perception: Attention and perception normal. She does not perceive auditory or visual hallucinations.        Mood and Affect: Mood is not anxious or depressed. Affect is not labile, blunt, angry or inappropriate.        Speech: Speech normal.        Behavior: Behavior normal.        Thought Content: Thought content normal. Thought content is not paranoid or delusional. Thought content does not include homicidal or suicidal ideation. Thought content does not include homicidal or suicidal plan.        Cognition and Memory: Cognition and memory normal.        Judgment: Judgment normal.     Comments: Insight intact      Lab Review:     Component Value Date/Time   NA 139 10/07/2019 0903   K 3.9 10/07/2019 0903   CL 100 10/07/2019 0903   CO2 27 10/07/2019 0903   GLUCOSE 104 (H) 10/07/2019 0903   GLUCOSE 105 (H) 07/12/2018 1417   BUN  13 10/07/2019 0903   CREATININE 0.92 10/07/2019 0903   CALCIUM 9.6 10/07/2019 0903   PROT 6.3 10/07/2019 0903   ALBUMIN 4.1 10/07/2019 0903   AST 17 10/07/2019 0903   ALT 10 10/07/2019 0903   ALKPHOS 42 10/07/2019 0903   BILITOT 0.8 10/07/2019 0903   GFRNONAA 74 10/07/2019 0903   GFRAA 85 10/07/2019 0903       Component Value Date/Time   WBC 7.4 07/12/2018 1417   RBC 4.64 07/12/2018 1417   HGB 13.3 07/12/2018 1417   HCT 39.6 07/12/2018 1417   PLT 184 07/12/2018 1417   MCV 85.3 07/12/2018 1417   MCH 28.7 07/12/2018 1417   MCHC 33.6 07/12/2018 1417   RDW 12.5 07/12/2018 1417    No results found for: POCLITH, LITHIUM   No results found for: PHENYTOIN, PHENOBARB, VALPROATE, CBMZ   .res Assessment: Plan:    Kayliegh was seen today for follow-up and anxiety.  Diagnoses and all orders for this visit:  Generalized anxiety disorder -     PARoxetine (PAXIL) 20 MG tablet; Take 3 tablets (60 mg total) by mouth daily.  Panic disorder without agoraphobia -     PARoxetine (PAXIL) 20 MG tablet; Take 3 tablets (60 mg total) by mouth daily.    NR anxiety with paroxetine 40 for 6-7 weeks plus buspirone 30 BID.  Tolerated. However paroxetine 60 mg daily worked well after 6-8 weeks.. Disc SE  Disc SE in detail and SSRI withdrawal sx.  She has been able to reduce the clonazepam from scheduled to as needed and has not needed to take it nearly as often.  No med changes are indicated.  We discussed the  short-term risks associated with benzodiazepines including sedation and increased fall risk among others.  Discussed long-term side effect risk including dependence, potential withdrawal symptoms, and the potential eventual dose-related risk of dementia.  But recent studies from 2020 dispute this association between benzodiazepines and dementia risk. Newer studies in 2020 do not support an association with dementia.  FU 3 mos  Lynder Parents, MD, DFAPA '  Please see After Visit Summary  for patient specific instructions.  Future Appointments  Date Time Provider [place]  04/01/2020  1:30 PM Debbe Mounts, PhD [facility]    No orders of the defined types were placed in this encounter.   -------------------------------

## 2020-04-01 ENCOUNTER — Other Ambulatory Visit: Payer: Self-pay

## 2020-04-01 ENCOUNTER — Ambulatory Visit: Payer: BC Managed Care – PPO | Admitting: Genetic Counselor

## 2020-04-01 ENCOUNTER — Telehealth: Payer: Self-pay | Admitting: Interventional Cardiology

## 2020-04-01 NOTE — Telephone Encounter (Signed)
Spoke with Dr. Broadus John to see if the recommendation for echo, EKG and OV were just for baseline type reasons due to pt's history or if the son was having issues.  Dr. Broadus John said son not having issues.  Will route to Dr. Tamala Julian to see if ok to schedule son and get echo prior to appt?

## 2020-04-01 NOTE — Telephone Encounter (Signed)
New message    Patient saw Dr Broadus John today and she recommended that patient's son, Bennett Vanscyoc ([birth year]01[birth year]99), see Dr Tamala Julian and have an EKG and echo.  However, Cristie Hem is not a patient and has not seen a doctor in a while to get a referral and records.  Will it be ok to schedule an appt with Dr Tamala Julian without a referral and notes?  Mrs Briere will also send a mychart message to the nurse.

## 2020-04-02 NOTE — Telephone Encounter (Signed)
Yes, it is okay 

## 2020-04-06 NOTE — Telephone Encounter (Signed)
Spoke with Ms. Dorgan. Obtained phone number for Alex.  Spoke with Cristie Hem and arranged echo. They both understand DPR will need to be signed when he comes to the office if he'd like PHI shared. He was grateful for assistance.

## 2020-04-14 NOTE — Progress Notes (Signed)
Post-test Genetic Consultation  Encounter date: 04/01/2020  [name] is here today for her post-test genetic consult. We first reviewed her pedigree and she affirmed that there have been no changes to her family's medical history. While she does not have the traditional risk factors for HCM or family history of sudden death or HCM, she does present with increased severity (IVS:2.0 cm).  I informed them that she does not have a pathogenic variant for HCM. I explained the reasons as to why her genetic test was negative. Clinical testing is restricted to the coding regions of the gene thus precluding the regulatory control elements that may impact gene function and hence protein activity.  It is recommended that her first-degree relatives, namely her two children, sister and parents seek regular echocardiogram and EKG screening for HCM.   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 I reiterated that she does have a genetic condition and with increasing numbers of patients enrolling in this study the knowledgebase will significantly improve our understanding allowing future generations to be tested with a higher positive outcome. She verbalized understanding of this.  Plan [name]  is interested in enrolling in the Cardiomyopathy Study at [facility]. I walked through the Informed Consent paperwork with her. She signed the informed consent and blood was drawn for the study. Her blood will be mailed out to the [employer]  at [employer].    [name], Ph.D, [facility] Clinical Molecular Geneticist

## 2020-05-02 ENCOUNTER — Other Ambulatory Visit: Payer: Self-pay | Admitting: Psychiatry

## 2020-05-02 DIAGNOSIS — F410 Panic disorder [episodic paroxysmal anxiety] without agoraphobia: Secondary | ICD-10-CM

## 2020-05-02 DIAGNOSIS — F411 Generalized anxiety disorder: Secondary | ICD-10-CM

## 2020-07-07 ENCOUNTER — Encounter: Payer: Self-pay | Admitting: Psychiatry

## 2020-07-07 ENCOUNTER — Ambulatory Visit (INDEPENDENT_AMBULATORY_CARE_PROVIDER_SITE_OTHER): Payer: BC Managed Care – PPO | Admitting: Psychiatry

## 2020-07-07 ENCOUNTER — Other Ambulatory Visit: Payer: Self-pay

## 2020-07-07 DIAGNOSIS — F410 Panic disorder [episodic paroxysmal anxiety] without agoraphobia: Secondary | ICD-10-CM | POA: Diagnosis not present

## 2020-07-07 DIAGNOSIS — F411 Generalized anxiety disorder: Secondary | ICD-10-CM | POA: Diagnosis not present

## 2020-07-07 MED ORDER — PAROXETINE HCL 20 MG PO TABS: 60.0000 mg | ORAL_TABLET | Freq: Every day | ORAL | 1 refills | Status: DC

## 2020-07-07 MED ORDER — CLONAZEPAM 0.5 MG PO TABS: 0.5000 mg | ORAL_TABLET | Freq: Two times a day (BID) | ORAL | 1 refills | Status: DC | PRN

## 2020-07-07 NOTE — Progress Notes (Signed)
[name] [id number] [birth year] 49 y.o.  Subjective:   Patient ID:  [name] is a 49 y.o. (DOB [birth year]) female.  Chief Complaint:  Chief Complaint  Patient presents with   Follow-up    Medication Management   Anxiety    Medication Management    Anxiety Symptoms include dizziness. Patient reports no palpitations or shortness of breath.     [name] presents to the office today for follow-up of generalized anxiety disorder and panic disorder without agoraphobia.  Anxiety worse since January 2020.  She was first seen on May 18, 2020.  At that time she was taking sertraline 150 mg daily for 2 months without much change.  She was also taking a low-dose of clonazepam.   She agreed to cross taper to paroxetine 40 mg and to give it at least 8 weeks at that dose to get improved response.  Typically paroxetine is more effective for panic than is Lexapro she had taken in the past with buspirone.  Also just just suggested increasing buspirone to 30 mg twice daily to maximize response.  seen January 15, 2020.  Following was noted: NR anxiety with paroxetine 40 for 6-7 weeks plus buspirone 30 BID.  Tolerated. Increase paroxetine 60 mg daily. Schedule Klonopin 0.25 mg BID bc morning anxiety bc awakening with anxiety.  February 19, 1999 2100 appointment, the following is noted: Increased paroxetine 60 mg daily and Klonopin 0.5 mg daily since April 10. OK, but not as well as I want to.  Still needs the clonazepam.    Maybe some benefit from the increase paroxetine.   Crying spells for no reason lately.  Frustrated.  Still gets rushes of anxity with numbness tingling, butterflies in the stomach.  Eating is fine. But still anxiety is worse in the morning. Plan was to start buspirone due to limited response.  03/25/2020 appointment with the following noted: Better with less anxiety and now not having to take Klonopin daily.  Last taken it Saturday. No recent  panic and anxiety is generally managed.   Tolerating paroxetine 60 mg daily.  Plan: worked well so no change  07/07/20 appt with the following noted: Pretty good usually.  Overall OK but anxious today without reason and took extra clonazepam.   Not markedly depressed.  Worries over taking clonazepam. Felt she should get more out of 60 mg paroxetine.  More anxious on weekends fiirst thing. No SE paroxetine. Average clonopin is reduced to just every 3 weeks. Lately not enough sleep.   A little more anxious about heart condition.  Patient reports stable mood and denies depressed or irritable moods.    Patient denies difficulty with sleep initiation or maintenance. Denies appetite disturbance.  Patient reports that energy and motivation have been good.  Patient denies any difficulty with concentration.  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   Cardiovascular: Negative for palpitations.  Neurological: Positive for dizziness. Negative for tremors and weakness.    Medications: I have reviewed the patient's current medications.  Current Outpatient Medications  Medication Sig Dispense Refill   atenolol (TENORMIN) 25 MG tablet Take 25 mg by mouth daily.     clonazePAM (KLONOPIN) 0.5 MG tablet Take 1 tablet (0.5 mg total) by mouth 2 (two)  times daily as needed for anxiety. 60 tablet 1   losartan (COZAAR) 25 MG tablet Take 25 mg by mouth daily.      PARoxetine (PAXIL) 20 MG tablet Take 3 tablets (60 mg total) by mouth daily. 270 tablet 1   pravastatin (PRAVACHOL) 40 MG tablet Take 1 tablet by mouth daily.     Current Facility-Administered Medications  Medication Dose Route Frequency Provider Last Rate Last Admin    0.9 %  sodium chloride infusion  500 mL Intravenous Continuous Nandigam, Kavitha V, MD       0.9 %  sodium chloride infusion  500 mL Intravenous Once Nandigam, Venia Minks, MD        Medication Side Effects: None  Allergies:  Allergies  Allergen Reactions   Ciprofloxacin Rash    Past Medical History:  Diagnosis Date   Anxiety    Family history of breast cancer    GERD (gastroesophageal reflux disease)    Heart murmur    History of colonic polyps 05/27/2018   Hyperlipidemia    Hypertension    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 2017   Diverticulitis Mother        Aug. 2017   Irritable bowel syndrome Mother    Hypertension Father    Colon polyps Father    Hypertension Sister    Heart disease Maternal Grandmother    Diabetes Mellitus I Maternal Grandmother    Heart attack Paternal Grandfather    Colon cancer Other        mat great uncle, dx in 70's   Breast cancer Maternal Aunt 65   Lung cancer Maternal Aunt 25   Cancer Sister 3       eye tumor/cancer- unk the name   Other Sister        Brain tumors   Esophageal cancer Neg Hx    Stomach cancer Neg Hx    Rectal cancer Neg Hx    Sudden Cardiac Death Neg Hx     Social History   Socioeconomic History   Marital status: Married    Spouse name: Not on file   Number of children: 2   Years of education: Not on file   Highest education level: Not on file  Occupational History   Occupation: Pharmacist, hospital   Tobacco Use    Smoking status: Never Smoker   Smokeless tobacco: Never Used  Scientific laboratory technician Use: Never used  Substance and Sexual Activity   Alcohol use: No   Drug use: No   Sexual activity: Not on file  Other Topics Concern   Not on file  Social History Narrative   Not on file   Social Determinants of Health   Financial Resource Strain:    Difficulty of Paying Living Expenses: Not on file  Food Insecurity:    Worried About Union in the Last Year: Not on file   Ran  Out of Food in the Last Year: Not on file  Transportation Needs:    Lack of Transportation (Medical): Not on file   Lack of Transportation (Non-Medical): Not on file  Physical Activity:    Days of Exercise per Week: Not on file   Minutes of Exercise per Session: Not on file  Stress:    Feeling of Stress : Not on file  Social Connections:    Frequency of Communication with Friends and Family: Not on file   Frequency of Social Gatherings with Friends and Family: Not on file   Attends Religious Services: Not on file   Active Member of Clubs or Organizations: Not on file   Attends Archivist Meetings: Not on file   Marital Status: Not on file  Intimate Partner Violence:    Fear of Current or Ex-Partner: Not on file   Emotionally Abused: Not on file   Physically Abused: Not on file   Sexually Abused: Not on file    Past Medical History, Surgical history, Social history, and Family history were reviewed and updated as appropriate.   Please see review of systems for further details on the patient's review from today.   Objective:   Physical Exam:  There were no vitals taken for this visit.  Physical Exam Constitutional:      General: She is not in acute distress. Musculoskeletal:        General: No deformity.  Neurological:     Mental Status: She is alert and oriented to person, place, and time.     Coordination: Coordination normal.  Psychiatric:        Attention  and Perception: Attention and perception normal. She does not perceive auditory or visual hallucinations.        Mood and Affect: Mood is anxious. Mood is not depressed. Affect is not labile, blunt, angry or inappropriate.        Speech: Speech normal.        Behavior: Behavior normal.        Thought Content: Thought content normal. Thought content is not paranoid or delusional. Thought content does not include homicidal or suicidal ideation. Thought content does not include homicidal or suicidal plan.        Cognition and Memory: Cognition and memory normal.        Judgment: Judgment normal.     Comments: Insight intact      Lab Review:     Component Value Date/Time   NA 139 10/07/2019 0903   K 3.9 10/07/2019 0903   CL 100 10/07/2019 0903   CO2 27 10/07/2019 0903   GLUCOSE 104 (H) 10/07/2019 0903   GLUCOSE 105 (H) 07/12/2018 1417   BUN 13 10/07/2019 0903   CREATININE 0.92 10/07/2019 0903   CALCIUM 9.6 10/07/2019 0903   PROT 6.3 10/07/2019 0903   ALBUMIN 4.1 10/07/2019 0903   AST 17 10/07/2019 0903   ALT 10 10/07/2019 0903   ALKPHOS 42 10/07/2019 0903   BILITOT 0.8 10/07/2019 0903   GFRNONAA 74 10/07/2019 0903   GFRAA 85 10/07/2019 0903       Component Value Date/Time   WBC 7.4 07/12/2018 1417   RBC 4.64 07/12/2018 1417   HGB 13.3 07/12/2018 1417   HCT 39.6 07/12/2018 1417   PLT 184 07/12/2018 1417   MCV 85.3 07/12/2018 1417   MCH 28.7 07/12/2018 1417   MCHC 33.6 07/12/2018 1417   RDW 12.5 07/12/2018 1417    No results found for: POCLITH, LITHIUM  No results found for: PHENYTOIN, PHENOBARB, VALPROATE, CBMZ   .res Assessment: Plan:    Braedyn was seen today for follow-up and anxiety.  Diagnoses and all orders for this visit:  Generalized anxiety disorder -     PARoxetine (PAXIL) 20 MG tablet; Take 3 tablets (60 mg total) by mouth daily. -     clonazePAM (KLONOPIN) 0.5 MG tablet; Take 1 tablet (0.5 mg total) by mouth 2 (two) times daily as needed for  anxiety.  Panic disorder without agoraphobia -     PARoxetine (PAXIL) 20 MG tablet; Take 3 tablets (60 mg total) by mouth daily. -     clonazePAM (KLONOPIN) 0.5 MG tablet; Take 1 tablet (0.5 mg total) by mouth 2 (two) times daily as needed for anxiety.    NR anxiety with paroxetine 40 for 6-7 weeks plus buspirone 30 BID.  Tolerated. However paroxetine 60 mg daily worked pretty well after 6-8 weeks.. Disc SE  Disc SE in detail and SSRI withdrawal sx. She has dizziness as part of the heart condition.  She has been able to reduce the clonazepam from scheduled to as needed and has not needed to take it nearly as often.  No med changes are indicated.  We discussed the short-term risks associated with benzodiazepines including sedation and increased fall risk among others.  Discussed long-term side effect risk including dependence, potential withdrawal symptoms, and the potential eventual dose-related risk of dementia.  But recent studies from 2020 dispute this association between benzodiazepines and dementia risk. Newer studies in 2020 do not support an association with dementia.  FU 4 mos  Lynder Parents, MD, DFAPA '  Please see After Visit Summary for patient specific instructions.  No future appointments.  No orders of the defined types were placed in this encounter.   -------------------------------

## 2020-08-14 ENCOUNTER — Other Ambulatory Visit: Payer: BC Managed Care – PPO

## 2020-08-14 DIAGNOSIS — Z20822 Contact with and (suspected) exposure to covid-19: Secondary | ICD-10-CM

## 2020-08-15 LAB — NOVEL CORONAVIRUS, NAA: SARS-CoV-2, NAA: NOT DETECTED

## 2020-08-15 LAB — SARS-COV-2, NAA 2 DAY TAT

## 2020-10-15 ENCOUNTER — Telehealth: Payer: Self-pay | Admitting: Cardiology

## 2020-10-15 NOTE — Telephone Encounter (Signed)
Pt called due to being + for covid 19.  Mild symptoms of congestion, nasal, headache, aches she has been tested but results not back from health dept.  She has been vaccinated but no booster.  I did recommend monoclonal antibodies if she is +.  She understands to take tylenol prn and drink fluids

## 2020-11-03 ENCOUNTER — Other Ambulatory Visit: Payer: Self-pay

## 2020-11-03 ENCOUNTER — Encounter: Payer: Self-pay | Admitting: Psychiatry

## 2020-11-03 ENCOUNTER — Ambulatory Visit (INDEPENDENT_AMBULATORY_CARE_PROVIDER_SITE_OTHER): Payer: BC Managed Care – PPO | Admitting: Psychiatry

## 2020-11-03 DIAGNOSIS — F410 Panic disorder [episodic paroxysmal anxiety] without agoraphobia: Secondary | ICD-10-CM | POA: Diagnosis not present

## 2020-11-03 DIAGNOSIS — F411 Generalized anxiety disorder: Secondary | ICD-10-CM | POA: Diagnosis not present

## 2020-11-03 MED ORDER — PAROXETINE HCL 20 MG PO TABS: 60.0000 mg | ORAL_TABLET | Freq: Every day | ORAL | 1 refills | Status: DC

## 2020-11-03 NOTE — Progress Notes (Signed)
[name] [id number] 20-Nov-1970 50 y.o.  Subjective:   Patient ID:  [name] is a 50 y.o. (DOB 1970-12-24) female.  Chief Complaint:  Chief Complaint  Patient presents with  . Follow-up  . Anxiety    Anxiety Symptoms include dizziness. Patient reports no chest pain, palpitations or shortness of breath.     [name] presents to the office today for follow-up of generalized anxiety disorder and panic disorder without agoraphobia.  Anxiety worse since January 2020.  She was first seen on May 18, 2020.  At that time she was taking sertraline 150 mg daily for 2 months without much change.  She was also taking a low-dose of clonazepam.   She agreed to cross taper to paroxetine 40 mg and to give it at least 8 weeks at that dose to get improved response.  Typically paroxetine is more effective for panic than is Lexapro she had taken in the past with buspirone.  Also just just suggested increasing buspirone to 30 mg twice daily to maximize response.  seen January 15, 2020.  Following was noted: NR anxiety with paroxetine 40 for 6-7 weeks plus buspirone 30 BID.  Tolerated. Increase paroxetine 60 mg daily. Schedule Klonopin 0.25 mg BID bc morning anxiety bc awakening with anxiety.  February 19, 1999 2100 appointment, the following is noted: Increased paroxetine 60 mg daily and Klonopin 0.5 mg daily since April 10. OK, but not as well as I want to.  Still needs the clonazepam.    Maybe some benefit from the increase paroxetine.   Crying spells for no reason lately.  Frustrated.  Still gets rushes of anxity with numbness tingling, butterflies in the stomach.  Eating is fine. But still anxiety is worse in the morning. Plan was to start buspirone due to limited response.  03/25/2020 appointment with the following noted: Better with less anxiety and now not having to take Klonopin daily.  Last taken it Saturday. No recent panic and anxiety is generally managed.    Tolerating paroxetine 60 mg daily.  Plan: worked well so no change  07/07/20 appt with the following noted: Pretty good usually.  Overall OK but anxious today without reason and took extra clonazepam.   Not markedly depressed.  Worries over taking clonazepam. Felt she should get more out of 60 mg paroxetine.  More anxious on weekends fiirst thing. No SE paroxetine. Average clonopin is reduced to just every 3 weeks. Lately not enough sleep.   A little more anxious about heart condition. Plan: No med changes  11/03/2020 appointment with following noted: Still on paroxetine 60.  Clonazepam a little more often than she'd like 3 times weekly at 1/2 tablet over the last 3 weeks.  Sunday woke up with anxiety and needed more.  Tolerates it well.  No panic.  With anxiety stomach gets queasy and tingling in body and generally nervous and sometimes emotional with it out of frustration. Drives me crazy that did so well for so long on just paroxetine 20.  Doesn't feel normal entirely in the last 3 years after going to coworkers funeral that triggered this.  Patient reports stable mood and denies depressed or irritable moods.    Patient denies difficulty with sleep initiation or maintenance. Denies appetite disturbance.  Patient reports that energy and motivation have been good.  Patient denies any difficulty with concentration.  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  Past Psychiatric Medication Trials: Sertraline 150 no response but inadequate duration  Lexapro 30 with buspirone 20 twice daily with some response Paroxetine 60 (took 20 mg for years without relapse) Buspirone 30 BID Low-dose clonazepam  Review of Systems:  Review of Systems  Respiratory: Negative for shortness of breath.   Cardiovascular: Negative for chest pain and palpitations.  Neurological: Positive for dizziness. Negative for tremors and weakness.   DX HYPERTROPHIC OBSTRUCTIVE CARDIOMYOPATHY  Medications:  I have reviewed the patient's current medications.  Current Outpatient Medications  Medication Sig Dispense Refill  . atenolol (TENORMIN) 25 MG tablet Take 25 mg by mouth daily.    . clonazePAM (KLONOPIN) 0.5 MG tablet Take 1 tablet (0.5 mg total) by mouth 2 (two) times daily as needed for anxiety. 60 tablet 1  . losartan (COZAAR) 25 MG tablet Take 25 mg by mouth daily.     . pravastatin (PRAVACHOL) 40 MG tablet Take 1 tablet by mouth daily.    Marland Kitchen PARoxetine (PAXIL) 20 MG tablet Take 3 tablets (60 mg total) by mouth daily. 270 tablet 1   Current Facility-Administered Medications  Medication Dose Route Frequency Provider Last Rate Last Admin  . 0.9 %  sodium chloride infusion  500 mL Intravenous Continuous Nandigam, Kavitha V, MD      . 0.9 %  sodium chloride infusion  500 mL Intravenous Once Nandigam, Venia Minks, MD        Medication Side Effects: None  Allergies:  Allergies  Allergen Reactions  . Ciprofloxacin Rash    Past Medical History:  Diagnosis Date  . Anxiety   . Family history of breast cancer   . GERD (gastroesophageal reflux disease)   . Heart murmur   . History of colonic polyps 05/27/2018  . Hyperlipidemia   . Hypertension   . 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 Panic disorder     Family History  Problem Relation Age of Onset  . Hyperlipidemia Mother   . Hypertension Mother   . Heart attack Mother        May 2017, smoker  . GI Bleed Mother        Aug. 2017  . Diverticulitis Mother        Aug. 2017  . Irritable bowel syndrome Mother   . Hypertension Father   . Colon polyps Father   . Hypertension Sister   . Heart disease Maternal Grandmother   . Diabetes Mellitus I Maternal  Grandmother   . Heart attack Paternal Grandfather   . Colon cancer Other        mat great uncle, dx in 20's  . Breast cancer Maternal Aunt 22  . Lung cancer Maternal Aunt 31  . Cancer Sister 3       eye tumor/cancer- unk the name  . Other Sister        Brain tumors  . Esophageal cancer Neg Hx   . Stomach cancer Neg Hx   . Rectal cancer Neg Hx   . Sudden Cardiac Death Neg Hx     Social History   Socioeconomic History  . Marital status: Married    Spouse name: Not on file  . Number of children: 2  . Years of education: Not on file  . Highest education level: Not on file  Occupational  History  . Occupation: Pharmacist, hospital   Tobacco Use  . Smoking status: Never Smoker  . Smokeless tobacco: Never Used  Vaping Use  . Vaping Use: Never used  Substance and Sexual Activity  . Alcohol use: No  . Drug use: No  . Sexual activity: Not on file  Other Topics Concern  . Not on file  Social History Narrative  . Not on file   Social Determinants of Health   Financial Resource Strain: Not on file  Food Insecurity: Not on file  Transportation Needs: Not on file  Physical Activity: Not on file  Stress: Not on file  Social Connections: Not on file  Intimate Partner Violence: Not on file    Past Medical History, Surgical history, Social history, and Family history were reviewed and updated as appropriate.   Please see review of systems for further details on the patient's review from today.   Objective:   Physical Exam:  There were no vitals taken for this visit.  Physical Exam Constitutional:      General: She is not in acute distress. Musculoskeletal:        General: No deformity.  Neurological:     Mental Status: She is alert and oriented to person, place, and time.     Coordination: Coordination normal.  Psychiatric:        Attention and Perception: Attention and perception normal. She does not perceive auditory or visual hallucinations.        Mood and Affect: Mood is  anxious. Mood is not depressed. Affect is not labile, blunt, angry or inappropriate.        Speech: Speech normal.        Behavior: Behavior normal.        Thought Content: Thought content normal. Thought content is not paranoid or delusional. Thought content does not include homicidal or suicidal ideation. Thought content does not include homicidal or suicidal plan.        Cognition and Memory: Cognition and memory normal.        Judgment: Judgment normal.     Comments: Insight intact      Lab Review:     Component Value Date/Time   NA 139 10/07/2019 0903   K 3.9 10/07/2019 0903   CL 100 10/07/2019 0903   CO2 27 10/07/2019 0903   GLUCOSE 104 (H) 10/07/2019 0903   GLUCOSE 105 (H) 07/12/2018 1417   BUN 13 10/07/2019 0903   CREATININE 0.92 10/07/2019 0903   CALCIUM 9.6 10/07/2019 0903   PROT 6.3 10/07/2019 0903   ALBUMIN 4.1 10/07/2019 0903   AST 17 10/07/2019 0903   ALT 10 10/07/2019 0903   ALKPHOS 42 10/07/2019 0903   BILITOT 0.8 10/07/2019 0903   GFRNONAA 74 10/07/2019 0903   GFRAA 85 10/07/2019 0903       Component Value Date/Time   WBC 7.4 07/12/2018 1417   RBC 4.64 07/12/2018 1417   HGB 13.3 07/12/2018 1417   HCT 39.6 07/12/2018 1417   PLT 184 07/12/2018 1417   MCV 85.3 07/12/2018 1417   MCH 28.7 07/12/2018 1417   MCHC 33.6 07/12/2018 1417   RDW 12.5 07/12/2018 1417    No results found for: POCLITH, LITHIUM   No results found for: PHENYTOIN, PHENOBARB, VALPROATE, CBMZ   .res Assessment: Plan:    Latonna was seen today for follow-up and anxiety.  Diagnoses and all orders for this visit:  Generalized anxiety disorder -     PARoxetine (PAXIL) 20 MG tablet; Take  3 tablets (60 mg total) by mouth daily.  Panic disorder without agoraphobia -     PARoxetine (PAXIL) 20 MG tablet; Take 3 tablets (60 mg total) by mouth daily.  DX HYPERTROPHIC OBSTRUCTIVE CARDIOMYOPATHY   NR anxiety with paroxetine 40 for 6-7 weeks plus buspirone 30 BID.  Tolerated. However  paroxetine 60 mg daily worked pretty well after 6-8 weeks.. Disc SE  Disc SE in detail and SSRI withdrawal sx. She has dizziness as part of the heart condition.  She has been able to reduce the clonazepam from scheduled to as needed and has not needed to take it nearly as often.  No med changes are indicated.  But she gets frustrated at having to use it bc feels like a failure.  Option Abilify 5 mg potentiaion for residual sx.  Work on anxiety and acceptance of medS AND residual anxiety.  Hard on herself about it.  We discussed the short-term risks associated with benzodiazepines including sedation and increased fall risk among others.  Discussed long-term side effect risk including dependence, potential withdrawal symptoms, and the potential eventual dose-related risk of dementia.  But recent studies from 2020 dispute this association between benzodiazepines and dementia risk. Newer studies in 2020 do not support an association with dementia.  FU 4-6 mos  Lynder Parents, MD, DFAPA '  Please see After Visit Summary for patient specific instructions.  No future appointments.  No orders of the defined types were placed in this encounter.   -------------------------------

## 2020-12-15 ENCOUNTER — Other Ambulatory Visit: Payer: Self-pay | Admitting: Family Medicine

## 2020-12-15 DIAGNOSIS — Z1231 Encounter for screening mammogram for malignant neoplasm of breast: Secondary | ICD-10-CM

## 2021-01-18 ENCOUNTER — Telehealth: Payer: Self-pay | Admitting: Physician Assistant

## 2021-01-18 NOTE — Telephone Encounter (Signed)
   Patient called this evening because she felt she had developed atrial fibrillation, RVR.  She was sitting at dinner and had sudden onset of tachypalpitations.  She checked her Apple watch and noted that her heart rate was 130.  The watch was indicating that she was in atrial fibrillation.  She took several readings, all saying the same thing.  The episode lasted approximately 15 minutes.  The symptoms then improved and her heart rate normalized.  What to do? She is on atenolol 25 mg which she takes in the evenings.  She has not yet had the medication today.  She has resting bradycardia and is on Paxil, so she cannot be on a higher dose of the atenolol and it is problematic to switch her to another beta-blocker.  She is able to send messages to Dr. Tamala Julian with the data from her watch by My chart which she will do.  She later called back with another episode of the same thing.  She has not yet taken her evening atenolol.  She is having some anxiety from the situation.  Her heart rate has been up to 150 at 1 point.  I requested that she take the atenolol now.  I requested that she drink plenty of water and eat some salty foods this evening to make sure that she is hydrated.  I suggested that she take her Klonopin to relax her as well.  By the end of our conversation, her heart rate had improved.  She is a little dizzy, but says she will make sure that she is hydrated and eats something with salt.  I advised that if her heart rate is sustained greater than 150 for an hour, or she starts feeling dizzy and lightheaded, she needs to call 911 and come to the emergency room.  If she stabilizes and can stay at home overnight, I will send this message to Dr. Tamala Julian and he can advise further.  Ms. Fambrough is in agreement with this as a plan of care.  Rosaria Ferries, PA-C 01/18/2021 7:35 PM

## 2021-01-19 ENCOUNTER — Encounter (HOSPITAL_COMMUNITY): Payer: Self-pay | Admitting: Physician Assistant

## 2021-01-19 ENCOUNTER — Ambulatory Visit (HOSPITAL_COMMUNITY)
Admission: RE | Admit: 2021-01-19 | Discharge: 2021-01-19 | Disposition: A | Discharge status: Home / Self Care | Payer: BC Managed Care – PPO | Source: Ambulatory Visit | Attending: Physician Assistant | Admitting: Physician Assistant

## 2021-01-19 ENCOUNTER — Other Ambulatory Visit: Payer: Self-pay

## 2021-01-19 VITALS — BP 130/70 | HR 56 | Ht 67.0 in | Wt 227.6 lb

## 2021-01-19 DIAGNOSIS — Z7901 Long term (current) use of anticoagulants: Secondary | ICD-10-CM | POA: Diagnosis not present

## 2021-01-19 DIAGNOSIS — Z8249 Family history of ischemic heart disease and other diseases of the circulatory system: Secondary | ICD-10-CM | POA: Diagnosis not present

## 2021-01-19 DIAGNOSIS — Z6835 Body mass index (BMI) 35.0-35.9, adult: Secondary | ICD-10-CM | POA: Diagnosis not present

## 2021-01-19 DIAGNOSIS — D6869 Other thrombophilia: Secondary | ICD-10-CM | POA: Insufficient documentation

## 2021-01-19 DIAGNOSIS — Z7902 Long term (current) use of antithrombotics/antiplatelets: Secondary | ICD-10-CM | POA: Insufficient documentation

## 2021-01-19 DIAGNOSIS — R0683 Snoring: Secondary | ICD-10-CM | POA: Insufficient documentation

## 2021-01-19 DIAGNOSIS — R400 Somnolence: Secondary | ICD-10-CM | POA: Diagnosis not present

## 2021-01-19 DIAGNOSIS — I421 Obstructive hypertrophic cardiomyopathy: Secondary | ICD-10-CM | POA: Insufficient documentation

## 2021-01-19 DIAGNOSIS — I10 Essential (primary) hypertension: Secondary | ICD-10-CM | POA: Insufficient documentation

## 2021-01-19 DIAGNOSIS — E669 Obesity, unspecified: Secondary | ICD-10-CM | POA: Diagnosis not present

## 2021-01-19 DIAGNOSIS — Z79899 Other long term (current) drug therapy: Secondary | ICD-10-CM | POA: Insufficient documentation

## 2021-01-19 DIAGNOSIS — I480 Paroxysmal atrial fibrillation: Secondary | ICD-10-CM | POA: Diagnosis present

## 2021-01-19 MED ORDER — ASPIRIN EC 81 MG PO TBEC: 81.0000 mg | DELAYED_RELEASE_TABLET | Freq: Every day | ORAL | 3 refills | Status: DC

## 2021-01-19 MED ORDER — ATENOLOL 25 MG PO TABS: 25.0000 mg | ORAL_TABLET | Freq: Two times a day (BID) | ORAL | 3 refills | Status: DC

## 2021-01-19 MED ORDER — APIXABAN 5 MG PO TABS: 5.0000 mg | ORAL_TABLET | Freq: Two times a day (BID) | ORAL | 3 refills | Status: DC

## 2021-01-19 NOTE — Progress Notes (Signed)
Primary Care Physician: Marda Stalker, PA-C Primary Cardiologist: Dr Tamala Julian Primary Electrophysiologist: none Referring Physician: Dr Smith/[facility] triage    [name] is a 50 y.o. female with a history of HOCM, HTN, atrial fibrillation who presents for consultation in the [facility].  The patient was initially diagnosed with atrial fibrillation 01/18/21 on her Apple Watch (reviewed by Dr Tamala Julian and personally reviewed) with symptoms of tachypalpitations and intermittent lightheadedness. She reports she was in and out of afib for about 4 hours. There were no specific triggers she could identify. She call the on call provider at [facility] and she was instructed to take her atenolol dose early. Patient has a CHADS2VASC score of 2. She denies significant alcohol use but she does have snoring and daytime somnolence.   Today, she denies symptoms of chest pain, shortness of breath, orthopnea, PND, lower extremity edema, presyncope, syncope, bleeding, or neurologic sequela. The patient is tolerating medications without difficulties and is otherwise without complaint today.    Atrial Fibrillation Risk Factors:  she does have symptoms or diagnosis of sleep apnea. she does not have a history of rheumatic fever. she does not have a history of alcohol use. The patient does not have a history of early familial atrial fibrillation or other arrhythmias.  she has a BMI of Body mass index is 35.65 kg/m.Marland Kitchen Filed Weights   01/19/21 1434  Weight: 103.2 kg    Family History  Problem Relation Age of Onset  . Hyperlipidemia Mother   . Hypertension Mother   . Heart attack Mother        May 2017, smoker  . GI Bleed Mother        Aug. 2017  . Diverticulitis Mother        Aug. 2017  . Irritable bowel syndrome Mother   . Hypertension Father   . Colon polyps Father   . Hypertension Sister   . Heart disease Maternal Grandmother   . Diabetes Mellitus I  Maternal Grandmother   . Heart attack Paternal Grandfather   . Colon cancer Other        mat great uncle, dx in 29's  . Breast cancer Maternal Aunt 39  . Lung cancer Maternal Aunt 52  . Cancer Sister 3       eye tumor/cancer- unk the name  . Other Sister        Brain tumors  . Esophageal cancer Neg Hx   . Stomach cancer Neg Hx   . Rectal cancer Neg Hx   . Sudden Cardiac Death Neg Hx      Atrial Fibrillation Management history:  Previous antiarrhythmic drugs: none Previous cardioversions: none Previous ablations: none CHADS2VASC score: 2 Anticoagulation history: none   Past Medical History:  Diagnosis Date  . Anxiety   . Family history of breast cancer   . GERD (gastroesophageal reflux disease)   . Heart murmur   . History of colonic polyps 05/27/2018  . Hyperlipidemia   . Hypertension   . 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 Panic disorder    Past Surgical History:  Procedure Laterality Date  . COLONOSCOPY    . DILATION AND CURETTAGE OF UTERUS     for  miscarriage  . POLYPECTOMY      Current Outpatient Medications  Medication Sig Dispense Refill  . apixaban (ELIQUIS) 5 MG TABS tablet Take 1 tablet (5 mg total) by mouth 2 (two) times daily. 60 tablet 3  . Ascorbic Acid (VITAMIN C) 1000 MG tablet Take 1,000 mg by mouth daily.    Marland Kitchen atenolol (TENORMIN) 25 MG tablet Take 1 tablet (25 mg total) by mouth 2 (two) times daily. 180 tablet 3  . clonazePAM (KLONOPIN) 0.5 MG tablet Take 1 tablet (0.5 mg total) by mouth 2 (two) times daily as needed for anxiety. 60 tablet 1  . esomeprazole (NEXIUM) 20 MG capsule Take 20 mg by mouth daily at 12 noon.    . Omega-3 Fatty Acids (FISH OIL) 1000 MG CAPS Take by  mouth.    Marland Kitchen PARoxetine (PAXIL) 20 MG tablet Take 3 tablets (60 mg total) by mouth daily. 270 tablet 1  . pravastatin (PRAVACHOL) 40 MG tablet Take 1 tablet by mouth daily.     Current Facility-Administered Medications  Medication Dose Route Frequency Provider Last Rate Last Admin  . 0.9 %  sodium chloride infusion  500 mL Intravenous Continuous Nandigam, Kavitha V, MD      . 0.9 %  sodium chloride infusion  500 mL Intravenous Once Nandigam, Venia Minks, MD        Allergies  Allergen Reactions  . Ciprofloxacin Rash    Social History   Socioeconomic History  . Marital status: Married    Spouse name: Not on file  . Number of children: 2  . Years of education: Not on file  . Highest education level: Not on file  Occupational History  . Occupation: Pharmacist, hospital   Tobacco Use  . Smoking status: Never Smoker  . Smokeless tobacco: Never Used  Vaping Use  . Vaping Use: Never used  Substance and Sexual Activity  . Alcohol use: No  . Drug use: No  . Sexual activity: Not on file  Other Topics Concern  . Not on file  Social History Narrative  . Not on file   Social Determinants of Health   Financial Resource Strain: Not on file  Food Insecurity: Not on file  Transportation Needs: Not on file  Physical Activity: Not on file  Stress: Not on file  Social Connections: Not on file  Intimate Partner Violence: Not on file     ROS- All systems are reviewed and negative except as per the HPI above.  Physical Exam: Vitals:   01/19/21 1434  BP: 130/70  Pulse: (!) 56  Weight: 103.2 kg  Height: 5\' 7"  (1.702 m)    GEN- The patient is a well appearing obese female, alert and oriented x 3 today.   Head- normocephalic, atraumatic Eyes-  Sclera clear, conjunctiva pink Ears- hearing intact Oropharynx- clear Neck- supple  Lungs- Clear to ausculation bilaterally, normal work of breathing Heart- Regular rate and rhythm, no rubs or gallops. 2/6 systolic murmur  GI- soft, NT, ND, +  BS Extremities- no clubbing, cyanosis, or edema MS- no significant deformity or atrophy Skin- no rash or lesion Psych- euthymic mood, full affect Neuro- strength and sensation are intact  Wt Readings from Last 3 Encounters:  01/19/21 103.2 kg  03/15/20 93.3 kg  12/18/19 92.3 kg    EKG today demonstrates  SB Vent. rate 56 BPM PR interval 162 ms QRS duration 92 ms QT/QTcB 434/418 ms  Echo 10/07/19 demonstrated  1. There is severe asymmetric hypertrophy of the basal septum.  The LOVT  gradient at rest by direct measurement is ~120 mmHG. Using the MR jet, a  gradient of 115 mmg is estimated (assumed LA pressure of 15 mmHG). With  valsalva the gradients do not appear  to increase but the signal is contaminated by MR. There is systolic  anterior motion of the mitral valve. Findings are consistent with  hypertrophic obstructive cardiomyopathy. Appears to be sigmoid variant.  Would consider cardiac MR for better  characterization/quantification and LGE assessment.  2. Left ventricular ejection fraction, by visual estimation, is 60 to  65%. The left ventricle has normal function. There is severely increased  left ventricular hypertrophy.  3. Left ventricular diastolic parameters are consistent with Grade I  diastolic dysfunction (impaired relaxation).  4. The left ventricle has no regional wall motion abnormalities.  5. Global right ventricle has normal systolic function.The right  ventricular size is normal. No increase in right ventricular wall  thickness.  6. Left atrial size was normal.  7. Right atrial size was normal.  8. Presence of pericardial fat pad.  9. Trivial pericardial effusion is present.  10. The mitral valve is degenerative. Mild mitral valve regurgitation.  11. The tricuspid valve is grossly normal. Tricuspid valve regurgitation  is trivial.  12. The aortic valve is tricuspid. Aortic valve regurgitation is not  visualized. No evidence of aortic valve  sclerosis or stenosis.  13. The pulmonic valve was grossly normal. Pulmonic valve regurgitation is  not visualized.  14. Mildly elevated pulmonary artery systolic pressure.  15. The tricuspid regurgitant velocity is 2.71 m/s, and with an assumed  right atrial pressure of 3 mmHg, the estimated right ventricular systolic  pressure is mildly elevated at 32.4 mmHg.  16. The inferior vena cava is normal in size with greater than 50%  respiratory variability, suggesting right atrial pressure of 3 mmHg.   Epic records are reviewed at length today  CHA2DS2-VASc Score = 2  The patient's score is based upon: CHF History: No HTN History: Yes Diabetes History: No Stroke History: No Vascular Disease History: No Age Score: 0 Gender Score: 1      ASSESSMENT AND PLAN: 1. Paroxysmal Atrial Fibrillation (ICD10:  I48.0) The patient's CHA2DS2-VASc score is 2, indicating a 2.2% annual risk of stroke.   General education about afib provided and questions answered. We also discussed her stroke risk and the risks and benefits of anticoagulation. With a CV score of 2 (female, HTN) and diagnosis of HOCM, would recommend anticoagulation for stroke prevention. Start Eliquis 5 mg BID Continue atenolol 25 mg BID. Recall she had significant bradycardia on metoprolol.   2. Secondary Hypercoagulable State (ICD10:  D68.69) The patient is at significant risk for stroke/thromboembolism based upon her CHA2DS2-VASc Score of 2.  Start Apixaban (Eliquis).   3. Obesity Body mass index is 35.65 kg/m. Lifestyle modification was discussed at length including regular exercise and weight reduction.  4. Snoring/daytime somnolence  The importance of adequate treatment of sleep apnea was discussed today in order to improve our ability to maintain sinus rhythm long term. Will refer for sleep study.  5. HOCM Followed by Dr Mina Marble at [facility] clinic.   6. HTN Stable, no changes today.   Follow up in the AF clinic in  one month.    [facility] [address] [place], [place] [address] [phone] 01/19/2021 3:46 PM

## 2021-01-19 NOTE — Telephone Encounter (Signed)
Spoke with pt and reviewed information from Dr. Tamala Julian.  She will see the AF Clinic this afternoon at 2:30pm.  Gave parking code and directions.  Pt appreciative for call.

## 2021-01-19 NOTE — Patient Instructions (Signed)
Stop aspirin  Start Eliquis 5 mg twice a day.

## 2021-01-20 ENCOUNTER — Telehealth: Payer: Self-pay | Admitting: *Deleted

## 2021-01-20 NOTE — Telephone Encounter (Signed)
Staff message sent to Gae Bon ok to schedule sleep study. Per [employer] portal no PA is required.

## 2021-01-31 ENCOUNTER — Telehealth: Payer: Self-pay | Admitting: *Deleted

## 2021-01-31 ENCOUNTER — Other Ambulatory Visit: Payer: Self-pay | Admitting: Psychiatry

## 2021-01-31 DIAGNOSIS — F411 Generalized anxiety disorder: Secondary | ICD-10-CM

## 2021-01-31 DIAGNOSIS — R400 Somnolence: Secondary | ICD-10-CM

## 2021-01-31 DIAGNOSIS — F410 Panic disorder [episodic paroxysmal anxiety] without agoraphobia: Secondary | ICD-10-CM

## 2021-01-31 NOTE — Telephone Encounter (Signed)
-----   Message from Lauralee Evener, [place] sent at 01/20/2021  9:37 AM EDT ----- Regarding: RE: sleep study Ok to schedule sleep study. Per [employer] portal no PA is required. ----- Message ----- From: Juluis Mire, RN Sent: 01/19/2021   3:24 PM EDT To: [facility] Alert Sleep Studies Subject: sleep study                                    Pt needs sleep study for afib, snoring and daytime somnolence per clint fenton Thanks Stacy

## 2021-02-01 NOTE — Telephone Encounter (Signed)
Next apt 5/12

## 2021-02-03 ENCOUNTER — Ambulatory Visit: Payer: BC Managed Care – PPO

## 2021-02-09 ENCOUNTER — Inpatient Hospital Stay: Admission: RE | Admit: 2021-02-09 | Payer: BC Managed Care – PPO | Source: Ambulatory Visit

## 2021-02-11 ENCOUNTER — Inpatient Hospital Stay: Admission: RE | Admit: 2021-02-11 | Payer: BC Managed Care – PPO | Source: Ambulatory Visit

## 2021-02-11 NOTE — Telephone Encounter (Signed)
Patient is scheduled for lab study on 6/5//22. Patient understands her sleep study will be done at [facility] sleep lab. Patient understands she will receive a sleep packet in a week or so. Patient understands to call if she does not receive the sleep packet in a timely manner. Patient agrees with treatment and thanked me for call.

## 2021-02-17 ENCOUNTER — Ambulatory Visit (HOSPITAL_COMMUNITY): Payer: BC Managed Care – PPO | Admitting: Physician Assistant

## 2021-02-22 ENCOUNTER — Ambulatory Visit (HOSPITAL_COMMUNITY): Payer: BC Managed Care – PPO | Admitting: Physician Assistant

## 2021-03-02 ENCOUNTER — Other Ambulatory Visit: Payer: Self-pay

## 2021-03-02 ENCOUNTER — Encounter (HOSPITAL_COMMUNITY): Payer: Self-pay | Admitting: Physician Assistant

## 2021-03-02 ENCOUNTER — Ambulatory Visit (HOSPITAL_COMMUNITY)
Admission: RE | Admit: 2021-03-02 | Discharge: 2021-03-02 | Disposition: A | Discharge status: Home / Self Care | Payer: BC Managed Care – PPO | Source: Ambulatory Visit | Attending: Physician Assistant | Admitting: Physician Assistant

## 2021-03-02 VITALS — BP 118/62 | HR 63 | Ht 67.0 in | Wt 232.4 lb

## 2021-03-02 DIAGNOSIS — Z7901 Long term (current) use of anticoagulants: Secondary | ICD-10-CM | POA: Diagnosis not present

## 2021-03-02 DIAGNOSIS — I421 Obstructive hypertrophic cardiomyopathy: Secondary | ICD-10-CM | POA: Insufficient documentation

## 2021-03-02 DIAGNOSIS — R0683 Snoring: Secondary | ICD-10-CM | POA: Insufficient documentation

## 2021-03-02 DIAGNOSIS — Z79899 Other long term (current) drug therapy: Secondary | ICD-10-CM | POA: Insufficient documentation

## 2021-03-02 DIAGNOSIS — Z8249 Family history of ischemic heart disease and other diseases of the circulatory system: Secondary | ICD-10-CM | POA: Diagnosis not present

## 2021-03-02 DIAGNOSIS — Z6836 Body mass index (BMI) 36.0-36.9, adult: Secondary | ICD-10-CM | POA: Insufficient documentation

## 2021-03-02 DIAGNOSIS — E669 Obesity, unspecified: Secondary | ICD-10-CM | POA: Diagnosis not present

## 2021-03-02 DIAGNOSIS — D6869 Other thrombophilia: Secondary | ICD-10-CM | POA: Diagnosis not present

## 2021-03-02 DIAGNOSIS — I480 Paroxysmal atrial fibrillation: Secondary | ICD-10-CM | POA: Diagnosis present

## 2021-03-02 DIAGNOSIS — I10 Essential (primary) hypertension: Secondary | ICD-10-CM | POA: Insufficient documentation

## 2021-03-02 DIAGNOSIS — Z880 Allergy status to penicillin: Secondary | ICD-10-CM | POA: Diagnosis not present

## 2021-03-02 DIAGNOSIS — Z7902 Long term (current) use of antithrombotics/antiplatelets: Secondary | ICD-10-CM | POA: Diagnosis not present

## 2021-03-02 DIAGNOSIS — R400 Somnolence: Secondary | ICD-10-CM | POA: Insufficient documentation

## 2021-03-02 LAB — CBC
HCT: 37.4 % (ref 36.0–46.0)
Hemoglobin: 12.5 g/dL (ref 12.0–15.0)
MCH: 29.3 pg (ref 26.0–34.0)
MCHC: 33.4 g/dL (ref 30.0–36.0)
MCV: 87.6 fL (ref 80.0–100.0)
Platelets: 174 10*3/uL (ref 150–400)
RBC: 4.27 MIL/uL (ref 3.87–5.11)
RDW: 12.9 % (ref 11.5–15.5)
WBC: 5.6 10*3/uL (ref 4.0–10.5)
nRBC: 0 % (ref 0.0–0.2)

## 2021-03-02 LAB — BASIC METABOLIC PANEL
Anion gap: 5 (ref 5–15)
BUN: 16 mg/dL (ref 6–20)
CO2: 27 mmol/L (ref 22–32)
Calcium: 9.2 mg/dL (ref 8.9–10.3)
Chloride: 105 mmol/L (ref 98–111)
Creatinine, Ser: 0.9 mg/dL (ref 0.44–1.00)
GFR, Estimated: >60 mL/min (ref >60)
Glucose, Bld: 89 mg/dL (ref 70–99)
Potassium: 3.9 mmol/L (ref 3.5–5.1)
Sodium: 137 mmol/L (ref 135–145)

## 2021-03-02 NOTE — Progress Notes (Signed)
Primary Care Physician: Marda Stalker, PA-C Primary Cardiologist: Dr Tamala Julian Primary Electrophysiologist: none Referring Physician: Dr Smith/[facility] triage  HOCM Clinic: Dr Lovell Sheehan is a 50 y.o. female with a history of HOCM, HTN, atrial fibrillation who presents for follow up in the [facility].  The patient was initially diagnosed with atrial fibrillation 01/18/21 on her Apple Watch (reviewed by Dr Tamala Julian and personally reviewed) with symptoms of tachypalpitations and intermittent lightheadedness. She reports she was in and out of afib for about 4 hours. There were no specific triggers she could identify. She call the on call provider at [facility] and she was instructed to take her atenolol dose early. Patient has a CHADS2VASC score of 2. She denies significant alcohol use but she does have snoring and daytime somnolence.   On follow up today, patient reports she has done well since her last visit. She has very brief palpitations (1-2 seconds) but these are rare. No tachypalpitations. She denies any bleeding issues on anticoagulation. Her atenolol was decreased to 25 mg daily due to symptomatic bradycardia.   Today, she denies symptoms of chest pain, shortness of breath, orthopnea, PND, lower extremity edema, presyncope, syncope, bleeding, or neurologic sequela. The patient is tolerating medications without difficulties and is otherwise without complaint today.    Atrial Fibrillation Risk Factors:  she does have symptoms or diagnosis of sleep apnea. Sleep study pending 03/27/21. she does not have a history of rheumatic fever. she does not have a history of alcohol use. The patient does not have a history of early familial atrial fibrillation or other arrhythmias.  she has a BMI of Body mass index is 36.4 kg/m.Marland Kitchen Filed Weights   03/02/21 1547  Weight: 105.4 kg    Family History  Problem Relation Age of Onset  . Hyperlipidemia Mother    . Hypertension Mother   . Heart attack Mother        May 2017, smoker  . GI Bleed Mother        Aug. 2017  . Diverticulitis Mother        Aug. 2017  . Irritable bowel syndrome Mother   . Hypertension Father   . Colon polyps Father   . Hypertension Sister   . Heart disease Maternal Grandmother   . Diabetes Mellitus I Maternal Grandmother   . Heart attack Paternal Grandfather   . Colon cancer Other        mat great uncle, dx in 32's  . Breast cancer Maternal Aunt 32  . Lung cancer Maternal Aunt 35  . Cancer Sister 3       eye tumor/cancer- unk the name  . Other Sister        Brain tumors  . Esophageal cancer Neg Hx   . Stomach cancer Neg Hx   . Rectal cancer Neg Hx   . Sudden Cardiac Death Neg Hx      Atrial Fibrillation Management history:  Previous antiarrhythmic drugs: none Previous cardioversions: none Previous ablations: none CHADS2VASC score: 2 Anticoagulation history: Eliquis   Past Medical History:  Diagnosis Date  . Anxiety   . Family history of breast cancer   . GERD (gastroesophageal reflux disease)   . Heart murmur   . History of colonic polyps 05/27/2018  . Hyperlipidemia   . Hypertension   . 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 Panic disorder    Past Surgical History:  Procedure Laterality Date  . COLONOSCOPY    . DILATION AND CURETTAGE OF UTERUS     for miscarriage  . POLYPECTOMY      Current Outpatient Medications  Medication Sig Dispense Refill  . apixaban (ELIQUIS) 5 MG TABS tablet Take 1 tablet (5 mg total) by mouth 2 (two) times daily. 60 tablet 3  . Ascorbic Acid (VITAMIN C) 1000 MG tablet Take 1,000 mg by mouth daily.    Marland Kitchen atenolol (TENORMIN) 25 MG tablet  Take 1 tablet (25 mg total) by mouth 2 (two) times daily. (Patient taking differently: Take 25 mg by mouth daily.) 180 tablet 3  . clonazePAM (KLONOPIN) 0.5 MG tablet TAKE 1 TABLET (0.5 MG TOTAL) BY MOUTH 2 (TWO) TIMES DAILY AS NEEDED FOR ANXIETY. 60 tablet 0  . esomeprazole (NEXIUM) 20 MG capsule Take 20 mg by mouth daily at 12 noon.    Marland Kitchen losartan (COZAAR) 50 MG tablet Take 50 mg by mouth daily.    . Omega-3 Fatty Acids (FISH OIL) 1000 MG CAPS Take by mouth.    Marland Kitchen PARoxetine (PAXIL) 20 MG tablet Take 3 tablets (60 mg total) by mouth daily. 270 tablet 1  . pravastatin (PRAVACHOL) 40 MG tablet Take 1 tablet by mouth daily.     Current Facility-Administered Medications  Medication Dose Route Frequency Provider Last Rate Last Admin  . 0.9 %  sodium chloride infusion  500 mL Intravenous Continuous Nandigam, Kavitha V, MD      . 0.9 %  sodium chloride infusion  500 mL Intravenous Once Nandigam, Venia Minks, MD        Allergies  Allergen Reactions  . Ciprofloxacin Rash    Social History   Socioeconomic History  . Marital status: Married    Spouse name: Not on file  . Number of children: 2  . Years of education: Not on file  . Highest education level: Not on file  Occupational History  . Occupation: Pharmacist, hospital   Tobacco Use  . Smoking status: Never Smoker  . Smokeless tobacco: Never Used  Vaping Use  . Vaping Use: Never used  Substance and Sexual Activity  . Alcohol use: No  . Drug use: No  . Sexual activity: Not on file  Other Topics Concern  . Not on file  Social History Narrative  . Not on file   Social Determinants of Health   Financial Resource Strain: Not on file  Food Insecurity: Not on file  Transportation Needs: Not on file  Physical Activity: Not on file  Stress: Not on file  Social Connections: Not on file  Intimate Partner Violence: Not on file     ROS- All systems are reviewed and negative except as per the HPI above.  Physical Exam: Vitals:   03/02/21 1547   BP: 118/62  Pulse: 63  Weight: 105.4 kg  Height: 5\' 7"  (1.702 m)    GEN- The patient is a well appearing obese female, alert and oriented x 3 today.   HEENT-head normocephalic, atraumatic, sclera clear, conjunctiva pink, hearing intact, trachea midline. Lungs- Clear to ausculation bilaterally, normal work of breathing Heart- Regular rate and rhythm, no murmurs, rubs or gallops  GI- soft, NT, ND, + BS Extremities- no clubbing, cyanosis, or edema MS- no significant deformity or atrophy Skin-  no rash or lesion Psych- euthymic mood, full affect Neuro- strength and sensation are intact   Wt Readings from Last 3 Encounters:  03/02/21 105.4 kg  01/19/21 103.2 kg  03/15/20 93.3 kg    EKG today demonstrates  SR Vent. rate 63 BPM PR interval 160 ms QRS duration 92 ms QT/QTcB 424/433 ms  Echo 10/07/19 demonstrated  1. There is severe asymmetric hypertrophy of the basal septum. The LOVT  gradient at rest by direct measurement is ~120 mmHG. Using the MR jet, a  gradient of 115 mmg is estimated (assumed LA pressure of 15 mmHG). With  valsalva the gradients do not appear  to increase but the signal is contaminated by MR. There is systolic  anterior motion of the mitral valve. Findings are consistent with  hypertrophic obstructive cardiomyopathy. Appears to be sigmoid variant.  Would consider cardiac MR for better  characterization/quantification and LGE assessment.  2. Left ventricular ejection fraction, by visual estimation, is 60 to  65%. The left ventricle has normal function. There is severely increased  left ventricular hypertrophy.  3. Left ventricular diastolic parameters are consistent with Grade I  diastolic dysfunction (impaired relaxation).  4. The left ventricle has no regional wall motion abnormalities.  5. Global right ventricle has normal systolic function.The right  ventricular size is normal. No increase in right ventricular wall  thickness.  6. Left atrial  size was normal.  7. Right atrial size was normal.  8. Presence of pericardial fat pad.  9. Trivial pericardial effusion is present.  10. The mitral valve is degenerative. Mild mitral valve regurgitation.  11. The tricuspid valve is grossly normal. Tricuspid valve regurgitation  is trivial.  12. The aortic valve is tricuspid. Aortic valve regurgitation is not  visualized. No evidence of aortic valve sclerosis or stenosis.  13. The pulmonic valve was grossly normal. Pulmonic valve regurgitation is  not visualized.  14. Mildly elevated pulmonary artery systolic pressure.  15. The tricuspid regurgitant velocity is 2.71 m/s, and with an assumed  right atrial pressure of 3 mmHg, the estimated right ventricular systolic  pressure is mildly elevated at 32.4 mmHg.  16. The inferior vena cava is normal in size with greater than 50%  respiratory variability, suggesting right atrial pressure of 3 mmHg.   Epic records are reviewed at length today  CHA2DS2-VASc Score = 2  The patient's score is based upon: CHF History: No HTN History: Yes Diabetes History: No Stroke History: No Vascular Disease History: No Age Score: 0 Gender Score: 1      ASSESSMENT AND PLAN: 1. Paroxysmal Atrial Fibrillation (ICD10:  I48.0) The patient's CHA2DS2-VASc score is 2, indicating a 2.2% annual risk of stroke.   Patient appears to be maintaining SR. Continue Eliquis 5 mg BID Continue atenolol 25 mg daily. She has not tolerated higher doses of rate control due to bradycardia. If rhythm control is needed, could consider sotalol, dofetilide, or disopyramide. Could also consider front line ablation.   2. Secondary Hypercoagulable State (ICD10:  D68.69) The patient is at significant risk for stroke/thromboembolism based upon her CHA2DS2-VASc Score of 2.  Continue Apixaban (Eliquis).   3. Obesity Body mass index is 36.4 kg/m. Lifestyle modification was discussed and encouraged including regular physical  activity and weight reduction.  4. Snoring/daytime somnolence  Scheduled for sleep study 03/27/21.  5. HOCM Followed by Dr Mina Marble at [facility] clinic.   6. HTN Stable, no changes today.   Follow up with Dr Mina Marble as scheduled. AF clinic  in 3 months.    [facility] [address] [place], [place] [address] [phone] 03/02/2021 3:58 PM

## 2021-03-03 ENCOUNTER — Encounter: Payer: Self-pay | Admitting: Psychiatry

## 2021-03-03 ENCOUNTER — Ambulatory Visit (INDEPENDENT_AMBULATORY_CARE_PROVIDER_SITE_OTHER): Payer: BC Managed Care – PPO | Admitting: Psychiatry

## 2021-03-03 DIAGNOSIS — F410 Panic disorder [episodic paroxysmal anxiety] without agoraphobia: Secondary | ICD-10-CM

## 2021-03-03 DIAGNOSIS — F411 Generalized anxiety disorder: Secondary | ICD-10-CM

## 2021-03-03 MED ORDER — PAROXETINE HCL 20 MG PO TABS: 60.0000 mg | ORAL_TABLET | Freq: Every day | ORAL | 1 refills | Status: DC

## 2021-03-03 MED ORDER — CLONAZEPAM 0.5 MG PO TABS: 0.5000 mg | ORAL_TABLET | Freq: Two times a day (BID) | ORAL | 0 refills | Status: DC | PRN

## 2021-03-03 NOTE — Progress Notes (Signed)
Athalene Kolle [id number] 11-03-1970 50 y.o.  Subjective:   Patient ID:  [name] is a 50 y.o. (DOB 03-01-1971) female.  Chief Complaint:  Chief Complaint  Patient presents with  . Follow-up  . Generalized anxiety disorder    Anxiety Symptoms include dizziness and palpitations. Patient reports no chest pain or shortness of breath.     Maybelle Alga [place] presents to the office today for follow-up of generalized anxiety disorder and panic disorder without agoraphobia.  Anxiety worse since January 2020.  She was first seen on May 18, 2020.  At that time she was taking sertraline 150 mg daily for 2 months without much change.  She was also taking a low-dose of clonazepam.   She agreed to cross taper to paroxetine 40 mg and to give it at least 8 weeks at that dose to get improved response.  Typically paroxetine is more effective for panic than is Lexapro she had taken in the past with buspirone.  Also just just suggested increasing buspirone to 30 mg twice daily to maximize response.  seen January 15, 2020.  Following was noted: NR anxiety with paroxetine 40 for 6-7 weeks plus buspirone 30 BID.  Tolerated. Increase paroxetine 60 mg daily. Schedule Klonopin 0.25 mg BID bc morning anxiety bc awakening with anxiety.  February 19, 1999 2100 appointment, the following is noted: Increased paroxetine 60 mg daily and Klonopin 0.5 mg daily since April 10. OK, but not as well as I want to.  Still needs the clonazepam.    Maybe some benefit from the increase paroxetine.   Crying spells for no reason lately.  Frustrated.  Still gets rushes of anxity with numbness tingling, butterflies in the stomach.  Eating is fine. But still anxiety is worse in the morning. Plan was to start buspirone due to limited response.  03/25/2020 appointment with the following noted: Better with less anxiety and now not having to take Klonopin daily.  Last taken it Saturday. No recent panic and  anxiety is generally managed.   Tolerating paroxetine 60 mg daily.  Plan: worked well so no change  07/07/20 appt with the following noted: Pretty good usually.  Overall OK but anxious today without reason and took extra clonazepam.   Not markedly depressed.  Worries over taking clonazepam. Felt she should get more out of 60 mg paroxetine.  More anxious on weekends fiirst thing. No SE paroxetine. Average clonopin is reduced to just every 3 weeks. Lately not enough sleep.   A little more anxious about heart condition. Plan: No med changes  11/03/2020 appointment with following noted: Still on paroxetine 60.  Clonazepam a little more often than she'd like 3 times weekly at 1/2 tablet over the last 3 weeks.  Sunday woke up with anxiety and needed more.  Tolerates it well.  No panic.  With anxiety stomach gets queasy and tingling in body and generally nervous and sometimes emotional with it out of frustration. Drives me crazy that did so well for so long on just paroxetine 20.  Doesn't feel normal entirely in the last 3 years after going to coworkers funeral that triggered this. Plan: No med changes  03/03/2021 appointment with the following noted: Tough 4 mos mainly related to F's unexpected death. Anxiety wise got through it OK.  That week daily clonazepam.  A little scare for herself with afib end of March corrected. Ongoing cardiac issues.  No excess clonazepam. Parents live 10 min away and helping mother more. Occ flareups  of anxiety that pass.    Patient reports stable mood and denies depressed or irritable moods.    Patient denies difficulty with sleep initiation or maintenance. Denies appetite disturbance.  Patient reports that energy and motivation have been good.  Patient denies any difficulty with concentration.  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  Past Psychiatric Medication Trials: Sertraline 150 no response but inadequate duration Lexapro 30 with buspirone  20 twice daily with some response Paroxetine 60 (took 20 mg for years without relapse) NR anxiety with paroxetine 40 for 6-7 weeks plus buspirone 30 BID. Buspirone 30 BID Low-dose clonazepam  Review of Systems:  Review of Systems  Respiratory: Negative for shortness of breath.   Cardiovascular: Positive for palpitations. Negative for chest pain.  Neurological: Positive for dizziness. Negative for tremors and weakness.   DX HYPERTROPHIC OBSTRUCTIVE CARDIOMYOPATHY  Medications: I have reviewed the patient's current medications.  Current Outpatient Medications  Medication Sig Dispense Refill  . apixaban (ELIQUIS) 5 MG TABS tablet Take 1 tablet (5 mg total) by mouth 2 (two) times daily. 60 tablet 3  . Ascorbic Acid (VITAMIN C) 1000 MG tablet Take 1,000 mg by mouth daily.    Marland Kitchen atenolol (TENORMIN) 25 MG tablet Take 1 tablet (25 mg total) by mouth 2 (two) times daily. (Patient taking differently: Take 25 mg by mouth daily.) 180 tablet 3  . esomeprazole (NEXIUM) 20 MG capsule Take 20 mg by mouth daily at 12 noon.    Marland Kitchen losartan (COZAAR) 50 MG tablet Take 50 mg by mouth daily.    . Omega-3 Fatty Acids (FISH OIL) 1000 MG CAPS Take by mouth.    . pravastatin (PRAVACHOL) 40 MG tablet Take 1 tablet by mouth daily.    . clonazePAM (KLONOPIN) 0.5 MG tablet Take 1 tablet (0.5 mg total) by mouth 2 (two) times daily as needed for anxiety. 60 tablet 0  . PARoxetine (PAXIL) 20 MG tablet Take 3 tablets (60 mg total) by mouth daily. 270 tablet 1   Current Facility-Administered Medications  Medication Dose Route Frequency Provider Last Rate Last Admin  . 0.9 %  sodium chloride infusion  500 mL Intravenous Continuous Nandigam, Kavitha V, MD      . 0.9 %  sodium chloride infusion  500 mL Intravenous Once Nandigam, Venia Minks, MD        Medication Side Effects: None  Allergies:  Allergies  Allergen Reactions  . Ciprofloxacin Rash    Past Medical History:  Diagnosis Date  . Anxiety   . Family history  of breast cancer   . GERD (gastroesophageal reflux disease)   . Heart murmur   . History of colonic polyps 05/27/2018  . Hyperlipidemia   . Hypertension   . 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 Panic disorder     Family History  Problem Relation Age of Onset  . Hyperlipidemia Mother   . Hypertension Mother   . Heart attack Mother        May 2017, smoker  . GI Bleed Mother        Aug. 2017  . Diverticulitis Mother        Aug. 2017  . Irritable bowel syndrome Mother   . Hypertension  Father   . Colon polyps Father   . Hypertension Sister   . Heart disease Maternal Grandmother   . Diabetes Mellitus I Maternal Grandmother   . Heart attack Paternal Grandfather   . Colon cancer Other        mat great uncle, dx in 74's  . Breast cancer Maternal Aunt 73  . Lung cancer Maternal Aunt 76  . Cancer Sister 3       eye tumor/cancer- unk the name  . Other Sister        Brain tumors  . Esophageal cancer Neg Hx   . Stomach cancer Neg Hx   . Rectal cancer Neg Hx   . Sudden Cardiac Death Neg Hx     Social History   Socioeconomic History  . Marital status: Married    Spouse name: Not on file  . Number of children: 2  . Years of education: Not on file  . Highest education level: Not on file  Occupational History  . Occupation: Pharmacist, hospital   Tobacco Use  . Smoking status: Never Smoker  . Smokeless tobacco: Never Used  Vaping Use  . Vaping Use: Never used  Substance and Sexual Activity  . Alcohol use: No  . Drug use: No  . Sexual activity: Not on file  Other Topics Concern  . Not on file  Social History Narrative  . Not on file   Social Determinants of Health   Financial Resource Strain: Not on file   Food Insecurity: Not on file  Transportation Needs: Not on file  Physical Activity: Not on file  Stress: Not on file  Social Connections: Not on file  Intimate Partner Violence: Not on file    Past Medical History, Surgical history, Social history, and Family history were reviewed and updated as appropriate.   Please see review of systems for further details on the patient's review from today.   Objective:   Physical Exam:  There were no vitals taken for this visit.  Physical Exam Constitutional:      General: She is not in acute distress. Musculoskeletal:        General: No deformity.  Neurological:     Mental Status: She is alert and oriented to person, place, and time.     Coordination: Coordination normal.  Psychiatric:        Attention and Perception: Attention and perception normal. She does not perceive auditory or visual hallucinations.        Mood and Affect: Mood is anxious. Mood is not depressed. Affect is not labile, blunt, angry, tearful or inappropriate.        Speech: Speech normal.        Behavior: Behavior normal.        Thought Content: Thought content normal. Thought content is not paranoid or delusional. Thought content does not include homicidal or suicidal ideation. Thought content does not include homicidal or suicidal plan.        Cognition and Memory: Cognition and memory normal.        Judgment: Judgment normal.     Comments: Insight intact      Lab Review:     Component Value Date/Time   NA 137 03/02/2021 1541   NA 139 10/07/2019 0903   K 3.9 03/02/2021 1541   CL 105 03/02/2021 1541   CO2 27 03/02/2021 1541   GLUCOSE 89 03/02/2021 1541   BUN 16 03/02/2021 1541   BUN 13 10/07/2019 0903   CREATININE 0.90 03/02/2021  1541   CALCIUM 9.2 03/02/2021 1541   PROT 6.3 10/07/2019 0903   ALBUMIN 4.1 10/07/2019 0903   AST 17 10/07/2019 0903   ALT 10 10/07/2019 0903   ALKPHOS 42 10/07/2019 0903   BILITOT 0.8 10/07/2019 0903   GFRNONAA >60  03/02/2021 1541   GFRAA 85 10/07/2019 0903       Component Value Date/Time   WBC 5.6 03/02/2021 1541   RBC 4.27 03/02/2021 1541   HGB 12.5 03/02/2021 1541   HCT 37.4 03/02/2021 1541   PLT 174 03/02/2021 1541   MCV 87.6 03/02/2021 1541   MCH 29.3 03/02/2021 1541   MCHC 33.4 03/02/2021 1541   RDW 12.9 03/02/2021 1541    No results found for: POCLITH, LITHIUM   No results found for: PHENYTOIN, PHENOBARB, VALPROATE, CBMZ   .res Assessment: Plan:    Viveca was seen today for follow-up and generalized anxiety disorder.  Diagnoses and all orders for this visit:  Generalized anxiety disorder -     clonazePAM (KLONOPIN) 0.5 MG tablet; Take 1 tablet (0.5 mg total) by mouth 2 (two) times daily as needed for anxiety. -     PARoxetine (PAXIL) 20 MG tablet; Take 3 tablets (60 mg total) by mouth daily.  Panic disorder without agoraphobia -     clonazePAM (KLONOPIN) 0.5 MG tablet; Take 1 tablet (0.5 mg total) by mouth 2 (two) times daily as needed for anxiety. -     PARoxetine (PAXIL) 20 MG tablet; Take 3 tablets (60 mg total) by mouth daily.  DX HYPERTROPHIC OBSTRUCTIVE CARDIOMYOPATHY   NR anxiety with paroxetine 40 for 6-7 weeks plus buspirone 30 BID.  Tolerated. However paroxetine 60 mg daily worked pretty well after 6-8 weeks.. Disc SE  Disc SE in detail and SSRI withdrawal sx. She has dizziness as part of the heart condition.  She has been able to reduce the clonazepam from scheduled to as needed and has not needed to take it nearly as often.  No med changes are indicated.  But she gets frustrated at having to use it bc feels like a failure.  Disc DDI paroxetine and beta blockers.  Work on anxiety and acceptance of medS AND residual anxiety.  Hard on herself about it.  We discussed the short-term risks associated with benzodiazepines including sedation and increased fall risk among others.  Discussed long-term side effect risk including dependence, potential withdrawal  symptoms, and the potential eventual dose-related risk of dementia.  But recent studies from 2020 dispute this association between benzodiazepines and dementia risk. Newer studies in 2020 do not support an association with dementia.  FU 4-6 mos  Lynder Parents, MD, DFAPA '  Please see After Visit Summary for patient specific instructions.  Future Appointments  Date Time Provider [place]  03/27/2021  8:00 PM Sueanne Margarita, MD [facility]  04/01/2021  2:50 PM [facility] MM 3 [facility]  06/02/2021  3:30 PM Fenton, Clint R, PA [facility] None    No orders of the defined types were placed in this encounter.   -------------------------------

## 2021-03-27 ENCOUNTER — Encounter (HOSPITAL_BASED_OUTPATIENT_CLINIC_OR_DEPARTMENT_OTHER): Payer: BC Managed Care – PPO | Admitting: Cardiology

## 2021-03-31 ENCOUNTER — Ambulatory Visit: Payer: BC Managed Care – PPO

## 2021-04-01 ENCOUNTER — Other Ambulatory Visit: Payer: Self-pay

## 2021-04-01 ENCOUNTER — Ambulatory Visit
Admission: RE | Admit: 2021-04-01 | Discharge: 2021-04-01 | Disposition: A | Discharge status: Home / Self Care | Payer: BC Managed Care – PPO | Source: Ambulatory Visit | Attending: Family Medicine | Admitting: Family Medicine

## 2021-04-01 DIAGNOSIS — Z1231 Encounter for screening mammogram for malignant neoplasm of breast: Secondary | ICD-10-CM

## 2021-05-14 ENCOUNTER — Other Ambulatory Visit (HOSPITAL_COMMUNITY): Payer: Self-pay | Admitting: Physician Assistant

## 2021-05-15 ENCOUNTER — Other Ambulatory Visit: Payer: Self-pay | Admitting: Psychiatry

## 2021-05-15 DIAGNOSIS — F411 Generalized anxiety disorder: Secondary | ICD-10-CM

## 2021-05-15 DIAGNOSIS — F410 Panic disorder [episodic paroxysmal anxiety] without agoraphobia: Secondary | ICD-10-CM

## 2021-05-24 ENCOUNTER — Other Ambulatory Visit: Payer: Self-pay

## 2021-05-24 ENCOUNTER — Ambulatory Visit (HOSPITAL_BASED_OUTPATIENT_CLINIC_OR_DEPARTMENT_OTHER): Payer: BC Managed Care – PPO | Attending: Physician Assistant | Admitting: Cardiology

## 2021-05-24 DIAGNOSIS — I480 Paroxysmal atrial fibrillation: Secondary | ICD-10-CM

## 2021-05-24 DIAGNOSIS — G4733 Obstructive sleep apnea (adult) (pediatric): Secondary | ICD-10-CM

## 2021-05-26 NOTE — Procedures (Signed)
   Patient Name: [name], [name] Date: 05/24/2021 Gender: Female D.O.B: Sep 11, 1971 Age (years): 44 Referring Provider: Malka So PA Height (inches): 67 Interpreting Physician: Fransico Him MD, ABSM Weight (lbs): 235 RPSGT: Carolin Coy BMI: 37 MRN: [id number] Neck Size: 14.50  CLINICAL INFORMATION Sleep Study Type: NPSG  Indication for sleep study: Hypertension, Obesity  Epworth Sleepiness Score: 3  SLEEP STUDY TECHNIQUE As per the AASM Manual for the Scoring of Sleep and Associated Events v2.3 (April 2016) with a hypopnea requiring 4% desaturations.  The channels recorded and monitored were frontal, central and occipital EEG, electrooculogram (EOG), submentalis EMG (chin), nasal and oral airflow, thoracic and abdominal wall motion, anterior tibialis EMG, snore microphone, electrocardiogram, and pulse oximetry.  MEDICATIONS Medications self-administered by patient taken the night of the study : N/A  SLEEP ARCHITECTURE The study was initiated at 11:01:19 PM and ended at 5:28:15 AM.  Sleep onset time was 23.7 minutes and the sleep efficiency was 76.6%. The total sleep time was 296.5 minutes.  Stage REM latency was 337.0 minutes.  The patient spent 22.9% of the night in stage N1 sleep, 68.3% in stage N2 sleep, 0.0% in stage N3 and 8.8% in REM.  Alpha intrusion was absent.  Supine sleep was 33.05%.  RESPIRATORY PARAMETERS The overall apnea/hypopnea index (AHI) was 12.3 per hour. There were 1 total apneas, including 1 obstructive, 0 central and 0 mixed apneas. There were 60 hypopneas and 113 RERAs.  The AHI during Stage REM sleep was 30.0 per hour.  AHI while supine was 18.4 per hour.  The mean oxygen saturation was 92.2%. The minimum SpO2 during sleep was 84.0%.  soft snoring was noted during this study.  CARDIAC DATA The 2 lead EKG demonstrated sinus rhythm. The mean heart rate was 51.0 beats per minute. Other EKG findings include: None.  LEG MOVEMENT  DATA The total PLMS were 0 with a resulting PLMS index of 0.0. Associated arousal with leg movement index was 0.8 .  IMPRESSIONS - Mild obstructive sleep apnea occurred during this study (AHI = 12.3/h). - Mild oxygen desaturation was noted during this study (Min O2 = 84.0%). - The patient snored with soft snoring volume. - No cardiac abnormalities were noted during this study. - Clinically significant periodic limb movements did not occur during sleep. No significant associated arousals.  DIAGNOSIS - Obstructive Sleep Apnea (G47.33)  RECOMMENDATIONS - Therapeutic CPAP titration to determine optimal pressure required to alleviate sleep disordered breathing. - Avoid alcohol, sedatives and other CNS depressants that may worsen sleep apnea and disrupt normal sleep architecture. - Sleep hygiene should be reviewed to assess factors that may improve sleep quality. - Weight management and regular exercise should be initiated or continued if appropriate.  [Electronically signed] 05/26/2021 09:11 PM  Fransico Him MD, ABSM Diplomate, American Board of Sleep Medicine

## 2021-06-02 ENCOUNTER — Other Ambulatory Visit: Payer: Self-pay

## 2021-06-02 ENCOUNTER — Encounter (HOSPITAL_COMMUNITY): Payer: Self-pay | Admitting: Physician Assistant

## 2021-06-02 ENCOUNTER — Ambulatory Visit (HOSPITAL_COMMUNITY)
Admission: RE | Admit: 2021-06-02 | Discharge: 2021-06-02 | Disposition: A | Discharge status: Home / Self Care | Payer: BC Managed Care – PPO | Source: Ambulatory Visit | Attending: Physician Assistant | Admitting: Physician Assistant

## 2021-06-02 VITALS — BP 120/80 | HR 57 | Ht 67.0 in | Wt 236.2 lb

## 2021-06-02 DIAGNOSIS — I421 Obstructive hypertrophic cardiomyopathy: Secondary | ICD-10-CM | POA: Insufficient documentation

## 2021-06-02 DIAGNOSIS — Z7901 Long term (current) use of anticoagulants: Secondary | ICD-10-CM | POA: Diagnosis not present

## 2021-06-02 DIAGNOSIS — G4733 Obstructive sleep apnea (adult) (pediatric): Secondary | ICD-10-CM | POA: Insufficient documentation

## 2021-06-02 DIAGNOSIS — I10 Essential (primary) hypertension: Secondary | ICD-10-CM | POA: Diagnosis not present

## 2021-06-02 DIAGNOSIS — Z79899 Other long term (current) drug therapy: Secondary | ICD-10-CM | POA: Insufficient documentation

## 2021-06-02 DIAGNOSIS — Z8249 Family history of ischemic heart disease and other diseases of the circulatory system: Secondary | ICD-10-CM | POA: Insufficient documentation

## 2021-06-02 DIAGNOSIS — E669 Obesity, unspecified: Secondary | ICD-10-CM | POA: Diagnosis not present

## 2021-06-02 DIAGNOSIS — I480 Paroxysmal atrial fibrillation: Secondary | ICD-10-CM

## 2021-06-02 DIAGNOSIS — D6869 Other thrombophilia: Secondary | ICD-10-CM | POA: Diagnosis not present

## 2021-06-02 DIAGNOSIS — Z713 Dietary counseling and surveillance: Secondary | ICD-10-CM | POA: Diagnosis not present

## 2021-06-02 DIAGNOSIS — Z6836 Body mass index (BMI) 36.0-36.9, adult: Secondary | ICD-10-CM | POA: Insufficient documentation

## 2021-06-02 NOTE — Progress Notes (Signed)
Primary Care Physician: Denise Stalker, PA-C Primary Cardiologist: Dr [name] Primary Electrophysiologist: none Referring Physician: Dr Smith/[facility] triage  HOCM Clinic: Dr [name] is a 50 y.o. female with a history of HOCM, HTN, atrial fibrillation who presents for follow up in the [facility].  The patient was initially diagnosed with atrial fibrillation 01/18/21 on her Apple Watch (reviewed by Dr [name] and personally reviewed) with symptoms of tachypalpitations and intermittent lightheadedness. She reports she was in and out of afib for about 4 hours. There were no specific triggers she could identify. She call the on call provider at [facility] and she was instructed to take her atenolol dose early. Patient has a CHADS2VASC score of 2. She denies significant alcohol use but she does have snoring and daytime somnolence.   On follow up today, patient reports that she has done well since her last visit. She denies any sustained episodes of palpitations. She denies any bleeding issues on anticoagulation.   Today, she denies symptoms of palpitations, chest pain, shortness of breath, orthopnea, PND, lower extremity edema, presyncope, syncope, bleeding, or neurologic sequela. The patient is tolerating medications without difficulties and is otherwise without complaint today.    Atrial Fibrillation Risk Factors:  she does have symptoms or diagnosis of sleep apnea. Sleep study pending 03/27/21. she does not have a history of rheumatic fever. she does not have a history of alcohol use. The patient does not have a history of early familial atrial fibrillation or other arrhythmias.  she has a BMI of Body mass index is 36.99 kg/m.Marland Kitchen Filed Weights   06/02/21 1538  Weight: 107.1 kg    Family History  Problem Relation Age of Onset   Hyperlipidemia Mother    Hypertension Mother    Heart attack Mother        May 2017, smoker   GI Bleed Mother         Aug. 2017   Diverticulitis Mother        Aug. 2017   Irritable bowel syndrome Mother    Hypertension Father    Colon polyps Father    Hypertension Sister    Heart disease Maternal Grandmother    Diabetes Mellitus I Maternal Grandmother    Heart attack Paternal Grandfather    Colon cancer Other        mat great uncle, dx in 11's   Breast cancer Maternal Aunt 38   Lung cancer Maternal Aunt 34   Cancer Sister 3       eye tumor/cancer- unk the name   Other Sister        Brain tumors   Esophageal cancer Neg Hx    Stomach cancer Neg Hx    Rectal cancer Neg Hx    Sudden Cardiac Death Neg Hx      Atrial Fibrillation Management history:  Previous antiarrhythmic drugs: none Previous cardioversions: none Previous ablations: none CHADS2VASC score: 2 Anticoagulation history: Eliquis   Past Medical History:  Diagnosis Date   Anxiety    Family history of breast cancer    GERD (gastroesophageal reflux disease)    Heart murmur    History of colonic polyps 05/27/2018   Hyperlipidemia    Hypertension    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     atenolol (TENORMIN) 25 MG tablet Take 1 tablet (25 mg total) by mouth 2 (two) times daily. (Patient taking differently: Take 25 mg by mouth daily.) 180 tablet 3   clonazePAM (KLONOPIN) 0.5 MG tablet Take 1 tablet (0.5 mg total) by mouth 2 (two) times daily as needed for  anxiety. 60 tablet 0   ELIQUIS 5 MG TABS tablet TAKE 1 TABLET BY MOUTH TWICE A DAY 60 tablet 11   esomeprazole (NEXIUM) 20 MG capsule Take 20 mg by mouth daily at 12 noon.     losartan (COZAAR) 50 MG tablet Take 50 mg by mouth daily.     Omega-3 Fatty Acids (FISH OIL) 1000 MG CAPS Take by mouth.     PARoxetine (PAXIL) 20 MG tablet TAKE 2 TABLETS BY MOUTH EVERY DAY (Patient taking differently: Take 60 mg by mouth daily.) 180 tablet 0   pravastatin (PRAVACHOL) 40 MG tablet Take 1 tablet by mouth daily.     Current Facility-Administered Medications  Medication Dose Route Frequency Provider Last Rate Last Admin   0.9 %  sodium chloride infusion  500 mL Intravenous Continuous Nandigam, Kavitha V, MD       0.9 %  sodium chloride infusion  500 mL Intravenous Once Nandigam, Venia Minks, MD        Allergies  Allergen Reactions   Ciprofloxacin Rash    Social History   Socioeconomic History   Marital status: Married    Spouse name: Not on file   Number of children: 2   Years of education: Not on file   Highest education level: Not on file  Occupational History   Occupation: Pharmacist, hospital   Tobacco Use   Smoking status: Never   Smokeless tobacco: Never  Vaping Use   Vaping Use: Never used  Substance and Sexual Activity   Alcohol use: No   Drug use: No   Sexual activity: Not on file  Other Topics Concern   Not on file  Social History Narrative   Not on file   Social Determinants of Health   Financial Resource Strain: Not on file  Food Insecurity: Not on file  Transportation Needs: Not on file  Physical Activity: Not on file  Stress: Not on file  Social Connections: Not on file  Intimate Partner Violence: Not on file     ROS- All systems are reviewed and negative except as per the HPI above.  Physical Exam: Vitals:   06/02/21 1538  BP: 120/80  Pulse: (!) 57  Weight: 107.1 kg  Height: '5\' 7"'$  (1.702 m)    GEN- The patient is a well appearing obese female, alert and oriented x  3 today.   HEENT-head normocephalic, atraumatic, sclera clear, conjunctiva pink, hearing intact, trachea midline. Lungs- Clear to ausculation bilaterally, normal work of breathing Heart- Regular rate and rhythm, no murmurs, rubs or gallops  GI- soft, NT, ND, + BS Extremities- no clubbing, cyanosis, or edema MS- no significant deformity or atrophy Skin- no rash or lesion Psych- euthymic mood, full affect Neuro- strength and sensation are intact  Wt Readings from Last 3 Encounters:  06/02/21 107.1 kg  05/24/21 106.6 kg  03/02/21 105.4 kg    EKG today demonstrates  SB Vent. rate 57 BPM PR interval 152 ms QRS duration 92 ms QT/QTcB 432/420 ms  Echo 10/07/19 demonstrated  1. There is severe asymmetric hypertrophy of the basal septum. The LOVT gradient at rest by direct measurement is ~120 mmHG. Using the MR jet, a gradient of 115 mmg is estimated (assumed LA pressure of 15 mmHG). With valsalva the gradients do not appear   to increase but the signal is contaminated by MR. There is systolic  anterior motion of the mitral valve. Findings are consistent with  hypertrophic obstructive cardiomyopathy. Appears to be sigmoid variant. Would consider cardiac MR for better  characterization/quantification and LGE assessment.   2. Left ventricular ejection fraction, by visual estimation, is 60 to  65%. The left ventricle has normal function. There is severely increased left ventricular hypertrophy.   3. Left ventricular diastolic parameters are consistent with Grade I  diastolic dysfunction (impaired relaxation).   4. The left ventricle has no regional wall motion abnormalities.   5. Global right ventricle has normal systolic function.The right  ventricular size is normal. No increase in right ventricular wall  thickness.   6. Left atrial size was normal.   7. Right atrial size was normal.   8. Presence of pericardial fat pad.   9. Trivial pericardial effusion is present.  10. The mitral  valve is degenerative. Mild mitral valve regurgitation.  11. The tricuspid valve is grossly normal. Tricuspid valve regurgitation  is trivial.  12. The aortic valve is tricuspid. Aortic valve regurgitation is not  visualized. No evidence of aortic valve sclerosis or stenosis.  13. The pulmonic valve was grossly normal. Pulmonic valve regurgitation is not visualized.  14. Mildly elevated pulmonary artery systolic pressure.  15. The tricuspid regurgitant velocity is 2.71 m/s, and with an assumed right atrial pressure of 3 mmHg, the estimated right ventricular systolic  pressure is mildly elevated at 32.4 mmHg.  16. The inferior vena cava is normal in size with greater than 50%  respiratory variability, suggesting right atrial pressure of 3 mmHg.   Epic records are reviewed at length today  CHA2DS2-VASc Score = 2  The patient's score is based upon: CHF History: No HTN History: Yes Diabetes History: No Stroke History: No Vascular Disease History: No Age Score: 0 Gender Score: 1      ASSESSMENT AND PLAN: 1. Paroxysmal Atrial Fibrillation (ICD10:  I48.0) The patient's CHA2DS2-VASc score is 2, indicating a 2.2% annual risk of stroke.   Patient appears to be maintaining SR. Continue Eliquis 5 mg BID Continue atenolol 25 mg daily. She has not tolerated higher doses of rate control due to bradycardia. If rhythm control is needed, could consider sotalol, dofetilide, or disopyramide. Could also consider front line ablation.  Apple Watch for home monitoring.  2. Secondary Hypercoagulable State (ICD10:  D68.69) The patient is at significant risk for stroke/thromboembolism based upon her CHA2DS2-VASc Score of 2.  Continue Apixaban (Eliquis).   3. Obesity Body mass index is 36.99 kg/m. Lifestyle modification was discussed and encouraged including regular physical activity and weight reduction.  4. OSA The importance of adequate treatment of sleep apnea was discussed today in order to  improve our ability to maintain sinus rhythm long term. Pending CPAP titration.   5. HOCM Followed by Dr Mina Marble at [facility].   6. HTN Stable, no changes today.  Follow up in the AF clinic in 6 months.    [facility] [address] [place], [place] [address] [phone] 06/02/2021 4:08 PM

## 2021-06-17 NOTE — Telephone Encounter (Signed)
Denise Margarita, MD  Freada Bergeron, CMA  Please let patient know that they have sleep apnea.  Recommend therapeutic CPAP titration for treatment of patient's sleep disordered breathing.  If unable to perform an in lab titration then initiate ResMed auto CPAP from 4 to 15cm H2O with heated humidity and mask of choice and overnight pulse ox on CPAP.

## 2021-06-20 NOTE — Telephone Encounter (Signed)
The patient has been notified of the result and verbalized understanding.  All questions (if any) were answered. Marolyn Hammock, [place] 06/20/2021 10:47 AM     Tittration to be precerted

## 2021-06-20 NOTE — Telephone Encounter (Signed)
Ok to schedule TITRATION, NO PA REQUIRED,  do not require Pre-Authorization by AIM

## 2021-06-20 NOTE — Addendum Note (Signed)
Addended by: Freada Bergeron on: 06/20/2021 11:06 AM   Modules accepted: Orders

## 2021-06-20 NOTE — Telephone Encounter (Signed)
Denise Margarita, MD  Freada Bergeron, CMA Please let patient know that they have sleep apnea.  Recommend therapeutic CPAP titration for treatment of patient's sleep disordered breathing.  If unable to perform an in lab titration then initiate ResMed auto CPAP from 4 to 15cm H2O with heated humidity and mask of choice and overnight pulse ox on CPAP.

## 2021-07-12 NOTE — Telephone Encounter (Signed)
Patient is scheduled for lab study on 08/11/21. Patient understands the sleep study will be done at [facility] sleep lab. Patient understands she will receive a sleep packet in a week or so. Patient understands to call if she does not receive the sleep packet in a timely manner.   @ [phone] OR [phone].

## 2021-08-11 ENCOUNTER — Encounter (HOSPITAL_BASED_OUTPATIENT_CLINIC_OR_DEPARTMENT_OTHER): Payer: BC Managed Care – PPO | Admitting: Cardiology

## 2021-08-15 ENCOUNTER — Encounter (HOSPITAL_BASED_OUTPATIENT_CLINIC_OR_DEPARTMENT_OTHER): Payer: Self-pay

## 2021-08-16 ENCOUNTER — Telehealth: Payer: Self-pay | Admitting: *Deleted

## 2021-08-16 NOTE — Telephone Encounter (Signed)
CC'd Chart Routing History  Routing History  From: Sueanne Margarita, MD On: 05/26/2021 09:13 PM  To: Freada Bergeron, CMA  Priority: Routine  Routing Comments:  Please let patient know that they have sleep apnea.  Recommend therapeutic CPAP titration for treatment of patient's sleep disordered breathing.  If unable to perform an in lab titration then initiate ResMed auto CPAP from 4 to 15cm H2O with heated humidity and mask of choice and overnight pulse ox on CPAP.

## 2021-08-16 NOTE — Telephone Encounter (Signed)
The patient has been notified of the result and verbalized understanding.  All questions (if any) were answered. Marolyn Hammock, [place] 08/16/2021 3:48 PM    Patient is scheduled for titration study on 08/18/21. Patient understands the sleep study will be done at [facility] sleep lab 8 pm.

## 2021-08-18 ENCOUNTER — Ambulatory Visit (HOSPITAL_BASED_OUTPATIENT_CLINIC_OR_DEPARTMENT_OTHER): Payer: BC Managed Care – PPO | Attending: Cardiology | Admitting: Cardiology

## 2021-08-18 ENCOUNTER — Other Ambulatory Visit: Payer: Self-pay

## 2021-08-18 VITALS — Ht 67.0 in | Wt 235.0 lb

## 2021-08-18 DIAGNOSIS — Z9989 Dependence on other enabling machines and devices: Secondary | ICD-10-CM | POA: Insufficient documentation

## 2021-08-18 DIAGNOSIS — G4733 Obstructive sleep apnea (adult) (pediatric): Secondary | ICD-10-CM | POA: Diagnosis not present

## 2021-08-18 DIAGNOSIS — Z79899 Other long term (current) drug therapy: Secondary | ICD-10-CM | POA: Diagnosis not present

## 2021-08-19 NOTE — Telephone Encounter (Signed)
The patient has been notified of the result and verbalized understanding.  All questions (if any) were answered. [name], [name] 08/19/2021 11:05 AM    Upon patient request DME selection is [facility] Patient understands he will be contacted by [facility] to set up his cpap. Patient understands to call if [facility] does not contact him with new setup in a timely manner. Patient understands they will be called once confirmation has been received from [facility] that they have received their new machine to schedule 10 week follow up appointment.   [facility] notified of new cpap order  Please add to airview Patient was grateful for the call and thanked me

## 2021-08-19 NOTE — Procedures (Signed)
   Patient Name: [name], [name] Date: 08/18/2021 Gender: Female D.O.B: 1970/11/08 Age (years): 57 Referring Provider: Malka So PA Height (inches): 67 Interpreting Physician: Fransico Him MD, ABSM Weight (lbs): 235 RPSGT: Jorge Ny BMI: 37 MRN: [id number] Neck Size: 14.50  CLINICAL INFORMATION The patient is referred for a CPAP titration to treat sleep apnea.  SLEEP STUDY TECHNIQUE As per the AASM Manual for the Scoring of Sleep and Associated Events v2.3 (April 2016) with a hypopnea requiring 4% desaturations.  The channels recorded and monitored were frontal, central and occipital EEG, electrooculogram (EOG), submentalis EMG (chin), nasal and oral airflow, thoracic and abdominal wall motion, anterior tibialis EMG, snore microphone, electrocardiogram, and pulse oximetry. Continuous positive airway pressure (CPAP) was initiated at the beginning of the study and titrated to treat sleep-disordered breathing.  MEDICATIONS Medications self-administered by patient taken the night of the study : ATENOLOL, Eliquis, LOSARTAN, Rosuvastatin  TECHNICIAN COMMENTS Comments added by technician: Patient was restless all through the night. Comments added by scorer: N/A  RESPIRATORY PARAMETERS Optimal PAP Pressure (cm):12  AHI at Optimal Pressure (/hr):0 Overall Minimal O2 (%):90.0  Supine % at Optimal Pressure (%):100 Minimal O2 at Optimal Pressure (%): 92.0   SLEEP ARCHITECTURE The study was initiated at 10:31:10 PM and ended at 5:01:28 AM.  Sleep onset time was 16.7 minutes and the sleep efficiency was 73.8%. The total sleep time was 288 minutes.  The patient spent 8.0% of the night in stage N1 sleep, 57.3% in stage N2 sleep, 0.0% in stage N3 and 34.7% in REM.Stage REM latency was 218.5 minutes  Wake after sleep onset was 85.6. Alpha intrusion was absent. Supine sleep was 55.21%.  CARDIAC DATA The 2 lead EKG demonstrated sinus rhythm. The mean heart rate was 49.5  beats per minute. Other EKG findings include: PVCs.  LEG MOVEMENT DATA The total Periodic Limb Movements of Sleep (PLMS) were 0. The PLMS index was 0.0. A PLMS index of <15 is considered normal in adults.  IMPRESSIONS - The optimal PAP pressure was 12 cm of water. - Central sleep apnea was not noted during this titration (CAI = 0.2/h). - Significant oxygen desaturations were not observed during this titration (min O2 = 90.0%). - The patient snored with soft snoring volume during this titration study. - 2-lead EKG demonstrated: PVCs - Clinically significant periodic limb movements were not noted during this study. Arousals associated with PLMs were rare.  DIAGNOSIS - Obstructive Sleep Apnea (G47.33)  RECOMMENDATIONS - Trial of CPAP therapy on 12 cm H2O with a Medium size Resmed Full Face Mask AirFit F20 mask and heated humidification. - Avoid alcohol, sedatives and other CNS depressants that may worsen sleep apnea and disrupt normal sleep architecture. - Sleep hygiene should be reviewed to assess factors that may improve sleep quality. - Weight management and regular exercise should be initiated or continued. - Return to Sleep Center for re-evaluation after 4 weeks of therapy  [Electronically signed] 08/19/2021 09:29 AM  Fransico Him MD, ABSM Diplomate, American Board of Sleep Medicine

## 2021-08-19 NOTE — Telephone Encounter (Signed)
Denise Margarita, MD  Freada Bergeron, CMA Please let patient know that they had a successful PAP titration and let DME know that orders are in [facility]  Please set up 6 week OV with me.

## 2021-09-01 ENCOUNTER — Ambulatory Visit: Payer: BC Managed Care – PPO | Admitting: Psychiatry

## 2021-09-01 ENCOUNTER — Other Ambulatory Visit: Payer: Self-pay

## 2021-09-01 ENCOUNTER — Encounter: Payer: Self-pay | Admitting: Psychiatry

## 2021-09-01 DIAGNOSIS — F410 Panic disorder [episodic paroxysmal anxiety] without agoraphobia: Secondary | ICD-10-CM | POA: Diagnosis not present

## 2021-09-01 DIAGNOSIS — F411 Generalized anxiety disorder: Secondary | ICD-10-CM | POA: Diagnosis not present

## 2021-09-01 MED ORDER — PAROXETINE HCL 30 MG PO TABS: 60.0000 mg | ORAL_TABLET | Freq: Every day | ORAL | 1 refills | Status: DC

## 2021-09-01 NOTE — Progress Notes (Signed)
[name] [id number] 11-20-1970 50 y.o.  Subjective:   Patient ID:  [name] is a 50 y.o. (DOB [birth year]) female.  Chief Complaint:  Chief Complaint  Patient presents with   Follow-up   Anxiety    Anxiety Symptoms include dizziness and palpitations. Patient reports no chest pain or shortness of breath.    [name] presents to the office today for follow-up of generalized anxiety disorder and panic disorder without agoraphobia.  Anxiety worse since January 2020.  She was first seen on May 18, 2020.  At that time she was taking sertraline 150 mg daily for 2 months without much change.  She was also taking a low-dose of clonazepam.   She agreed to cross taper to paroxetine 40 mg and to give it at least 8 weeks at that dose to get improved response.  Typically paroxetine is more effective for panic than is Lexapro she had taken in the past with buspirone.  Also just just suggested increasing buspirone to 30 mg twice daily to maximize response.  seen January 15, 2020.  Following was noted: NR anxiety with paroxetine 40 for 6-7 weeks plus buspirone 30 BID.  Tolerated. Increase paroxetine 60 mg daily. Schedule Klonopin 0.25 mg BID bc morning anxiety bc awakening with anxiety.  February 19, 1999 2100 appointment, the following is noted: Increased paroxetine 60 mg daily and Klonopin 0.5 mg daily since April 10. OK, but not as well as I want to.  Still needs the clonazepam.    Maybe some benefit from the increase paroxetine.   Crying spells for no reason lately.  Frustrated.  Still gets rushes of anxity with numbness tingling, butterflies in the stomach.  Eating is fine. But still anxiety is worse in the morning. Plan was to start buspirone due to limited response.  03/25/2020 appointment with the following noted: Better with less anxiety and now not having to take Klonopin daily.  Last taken it Saturday. No recent panic and anxiety is generally managed.    Tolerating paroxetine 60 mg daily.  Plan: worked well so no change  07/07/20 appt with the following noted: Pretty good usually.  Overall OK but anxious today without reason and took extra clonazepam.   Not markedly depressed.  Worries over taking clonazepam. Felt she should get more out of 60 mg paroxetine.  More anxious on weekends fiirst thing. No SE paroxetine. Average clonopin is reduced to just every 3 weeks. Lately not enough sleep.   A little more anxious about heart condition. Plan: No med changes  11/03/2020 appointment with following noted: Still on paroxetine 60.  Clonazepam a little more often than she'd like 3 times weekly at 1/2 tablet over the last 3 weeks.  Sunday woke up with anxiety and needed more.  Tolerates it well.  No panic.  With anxiety stomach gets queasy and tingling in body and generally nervous and sometimes emotional with it out of frustration. Drives me crazy that did so well for so long on just paroxetine 20.  Doesn't feel normal entirely in the last 3 years after going to coworkers funeral that triggered this. Plan: No med changes  03/03/2021 appointment with the following noted: Tough 4 mos mainly related to F's unexpected death. Anxiety wise got through it OK.  That week daily clonazepam.  A little scare for herself with afib end of March corrected. Ongoing cardiac issues.  No excess clonazepam. Parents live 10 min away and helping mother more. Occ flareups of anxiety that  pass.   Plan: continue paroxetine 60 and clonazepam 0.5 mg BID prn  09/01/2021 appointment with the following noted: Infrequent clonazepam 0.25 mg prn. Anxiety fluctuates with more in AUG DT stressors and better since then. Sleep is good.   SE none. No afib since here but still palpitations.   Empty nesters.    Patient reports stable mood and denies depressed or irritable moods.    Patient denies difficulty with sleep initiation or maintenance. Denies appetite disturbance.   Patient reports that energy and motivation have been good.  Patient denies any difficulty with concentration.  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  Past Psychiatric Medication Trials: Sertraline 150 no response but inadequate duration Lexapro 30 with buspirone 20 twice daily with some response Paroxetine 60 (took 20 mg for years without relapse) NR anxiety with paroxetine 40 for 6-7 weeks plus buspirone 30 BID. Buspirone 30 BID Low-dose clonazepam  Review of Systems:  Review of Systems  Respiratory:  Negative for chest tightness and shortness of breath.   Cardiovascular:  Positive for palpitations. Negative for chest pain.  Neurological:  Positive for dizziness. Negative for tremors and weakness.  DX HYPERTROPHIC OBSTRUCTIVE CARDIOMYOPATHY  Medications: I have reviewed the patient's current medications.  Current Outpatient Medications  Medication Sig Dispense Refill   Ascorbic Acid (VITAMIN C) 1000 MG tablet Take 1,000 mg by mouth daily.     atenolol (TENORMIN) 25 MG tablet Take 1 tablet (25 mg total) by mouth 2 (two) times daily. (Patient taking differently: Take 25 mg by mouth daily.) 180 tablet 3   clonazePAM (KLONOPIN) 0.5 MG tablet Take 1 tablet (0.5 mg total) by mouth 2 (two) times daily as needed for anxiety. 60 tablet 0   ELIQUIS 5 MG TABS tablet TAKE 1 TABLET BY MOUTH TWICE A DAY 60 tablet 11   esomeprazole (NEXIUM) 20 MG capsule Take 20 mg by mouth daily at 12 noon.     losartan (COZAAR) 50 MG tablet Take 50 mg by mouth daily.     Omega-3 Fatty Acids (FISH OIL) 1000 MG CAPS Take by mouth.     rosuvastatin (CRESTOR) 20 MG tablet Take 20 mg by mouth daily.     PARoxetine (PAXIL) 30 MG tablet Take 2 tablets (60 mg total) by mouth daily. 180 tablet 1   Current Facility-Administered Medications  Medication Dose Route Frequency Provider Last Rate Last Admin   0.9 %  sodium chloride infusion  500 mL Intravenous Continuous Nandigam, Kavitha V, MD       0.9 %   sodium chloride infusion  500 mL Intravenous Once Nandigam, Venia Minks, MD        Medication Side Effects: None  Allergies:  Allergies  Allergen Reactions   Ciprofloxacin Rash    Past Medical History:  Diagnosis Date   Anxiety    Family history of breast cancer    GERD (gastroesophageal reflux disease)    Heart murmur    History of colonic polyps 05/27/2018   Hyperlipidemia    Hypertension    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 2017   Diverticulitis Mother        Aug. 2017   Irritable bowel syndrome Mother    Hypertension Father    Colon polyps Father    Hypertension Sister    Heart disease Maternal Grandmother    Diabetes Mellitus I Maternal Grandmother    Heart attack Paternal Grandfather    Colon cancer Other        mat great uncle, dx in 40's   Breast cancer Maternal Aunt 54   Lung cancer Maternal Aunt 57   Cancer Sister 3       eye tumor/cancer- unk the name   Other Sister        Brain tumors   Esophageal cancer Neg Hx    Stomach cancer Neg Hx    Rectal cancer Neg Hx    Sudden Cardiac Death Neg Hx     Social History   Socioeconomic History   Marital status: Married    Spouse name: Not on file   Number of children: 2   Years of education: Not on file   Highest education level: Not on file  Occupational History   Occupation: Pharmacist, hospital   Tobacco Use   Smoking status: Never   Smokeless tobacco: Never  Vaping Use   Vaping Use: Never used  Substance and Sexual Activity   Alcohol use: No    Drug use: No   Sexual activity: Not on file  Other Topics Concern   Not on file  Social History Narrative   Not on file   Social Determinants of Health   Financial Resource Strain: Not on file  Food Insecurity: Not on file  Transportation Needs: Not on file  Physical Activity: Not on file  Stress: Not on file  Social Connections: Not on file  Intimate Partner Violence: Not on file    Past Medical History, Surgical history, Social history, and Family history were reviewed and updated as appropriate.   Please see review of systems for further details on the patient's review from today.   Objective:   Physical Exam:  There were no vitals taken for this visit.  Physical Exam Constitutional:      General: She is not in acute distress. Musculoskeletal:        General: No deformity.  Neurological:     Mental Status: She is alert and oriented to person, place, and time.     Coordination: Coordination normal.  Psychiatric:        Attention and Perception: Attention and perception normal. She does not perceive auditory or visual hallucinations.        Mood and Affect: Mood is anxious. Mood is not depressed. Affect is not labile, blunt, angry, tearful or inappropriate.        Speech: Speech normal.        Behavior: Behavior normal.        Thought Content: Thought content normal. Thought content is not paranoid or delusional. Thought content does not include homicidal or suicidal ideation.        Cognition and Memory: Cognition and memory normal.        Judgment: Judgment normal.     Comments: Insight intact     Lab Review:     Component Value Date/Time   NA 137 03/02/2021 1541   NA 139 10/07/2019 0903   K  3.9 03/02/2021 1541   CL 105 03/02/2021 1541   CO2 27 03/02/2021 1541   GLUCOSE 89 03/02/2021 1541   BUN 16 03/02/2021 1541   BUN 13 10/07/2019 0903   CREATININE 0.90 03/02/2021 1541   CALCIUM 9.2 03/02/2021 1541   PROT 6.3 10/07/2019 0903   ALBUMIN 4.1 10/07/2019  0903   AST 17 10/07/2019 0903   ALT 10 10/07/2019 0903   ALKPHOS 42 10/07/2019 0903   BILITOT 0.8 10/07/2019 0903   GFRNONAA >60 03/02/2021 1541   GFRAA 85 10/07/2019 0903       Component Value Date/Time   WBC 5.6 03/02/2021 1541   RBC 4.27 03/02/2021 1541   HGB 12.5 03/02/2021 1541   HCT 37.4 03/02/2021 1541   PLT 174 03/02/2021 1541   MCV 87.6 03/02/2021 1541   MCH 29.3 03/02/2021 1541   MCHC 33.4 03/02/2021 1541   RDW 12.9 03/02/2021 1541    No results found for: POCLITH, LITHIUM   No results found for: PHENYTOIN, PHENOBARB, VALPROATE, CBMZ   .res Assessment: Plan:    [name] was seen today for follow-up and anxiety.  Diagnoses and all orders for this visit:  Generalized anxiety disorder -     PARoxetine (PAXIL) 30 MG tablet; Take 2 tablets (60 mg total) by mouth daily.  Panic disorder without agoraphobia -     PARoxetine (PAXIL) 30 MG tablet; Take 2 tablets (60 mg total) by mouth daily. DX HYPERTROPHIC OBSTRUCTIVE CARDIOMYOPATHY   NR anxiety with paroxetine 40 for 6-7 weeks plus buspirone 30 BID.  Tolerated. However paroxetine 60 mg daily worked pretty well after 6-8 weeks.. Disc SE  Disc SE in detail and SSRI withdrawal sx. She has dizziness as part of the heart condition.  She has been able to reduce the clonazepam from scheduled to as needed and has not needed to take it nearly as often.  No med changes are indicated.  But she gets frustrated at having to use it bc feels like a failure.  Disc DDI paroxetine and beta blockers.  Option abilify potentiation if needed.  We discussed the short-term risks associated with benzodiazepines including sedation and increased fall risk among others.  Discussed long-term side effect risk including dependence, potential withdrawal symptoms, and the potential eventual dose-related risk of dementia.  But recent studies from 2020 dispute this association between benzodiazepines and dementia risk. Newer studies in 2020 do not  support an association with dementia.  FU 6 mos  Lynder Parents, MD, DFAPA '  Please see After Visit Summary for patient specific instructions.  Future Appointments  Date Time Provider [place]  12/06/2021  3:30 PM Fenton, Clint R, PA [id number] None    No orders of the defined types were placed in this encounter.   -------------------------------

## 2021-11-09 ENCOUNTER — Other Ambulatory Visit: Payer: Self-pay | Admitting: Psychiatry

## 2021-11-09 DIAGNOSIS — F410 Panic disorder [episodic paroxysmal anxiety] without agoraphobia: Secondary | ICD-10-CM

## 2021-11-09 DIAGNOSIS — F411 Generalized anxiety disorder: Secondary | ICD-10-CM

## 2021-12-06 ENCOUNTER — Other Ambulatory Visit: Payer: Self-pay

## 2021-12-06 ENCOUNTER — Ambulatory Visit (HOSPITAL_COMMUNITY)
Admission: RE | Admit: 2021-12-06 | Discharge: 2021-12-06 | Disposition: A | Discharge status: Home / Self Care | Payer: BC Managed Care – PPO | Source: Ambulatory Visit | Attending: Physician Assistant | Admitting: Physician Assistant

## 2021-12-06 VITALS — BP 150/72 | HR 64 | Ht 67.0 in | Wt 242.2 lb

## 2021-12-06 DIAGNOSIS — G4733 Obstructive sleep apnea (adult) (pediatric): Secondary | ICD-10-CM | POA: Insufficient documentation

## 2021-12-06 DIAGNOSIS — I421 Obstructive hypertrophic cardiomyopathy: Secondary | ICD-10-CM | POA: Insufficient documentation

## 2021-12-06 DIAGNOSIS — I493 Ventricular premature depolarization: Secondary | ICD-10-CM | POA: Insufficient documentation

## 2021-12-06 DIAGNOSIS — D6869 Other thrombophilia: Secondary | ICD-10-CM | POA: Diagnosis not present

## 2021-12-06 DIAGNOSIS — Z6837 Body mass index (BMI) 37.0-37.9, adult: Secondary | ICD-10-CM | POA: Insufficient documentation

## 2021-12-06 DIAGNOSIS — E669 Obesity, unspecified: Secondary | ICD-10-CM | POA: Diagnosis not present

## 2021-12-06 DIAGNOSIS — Z7901 Long term (current) use of anticoagulants: Secondary | ICD-10-CM | POA: Insufficient documentation

## 2021-12-06 DIAGNOSIS — I10 Essential (primary) hypertension: Secondary | ICD-10-CM | POA: Insufficient documentation

## 2021-12-06 DIAGNOSIS — Z79899 Other long term (current) drug therapy: Secondary | ICD-10-CM | POA: Insufficient documentation

## 2021-12-06 DIAGNOSIS — I480 Paroxysmal atrial fibrillation: Secondary | ICD-10-CM | POA: Insufficient documentation

## 2021-12-06 MED ORDER — ATENOLOL 25 MG PO TABS: 25.0000 mg | ORAL_TABLET | Freq: Every day | ORAL | Status: DC

## 2021-12-06 NOTE — Progress Notes (Signed)
Primary Care Physician: Marda Stalker, PA-C Primary Cardiologist: Dr Tamala Julian Primary Electrophysiologist: none Referring Physician: Dr Smith/[facility] triage  HOCM Clinic: Dr Lovell Sheehan is a 51 y.o. female with a history of HOCM, HTN, atrial fibrillation who presents for follow up in the [facility].  The patient was initially diagnosed with atrial fibrillation 01/18/21 on her Apple Watch (reviewed by Dr Tamala Julian and personally reviewed) with symptoms of tachypalpitations and intermittent lightheadedness. She reports she was in and out of afib for about 4 hours. There were no specific triggers she could identify. She call the on call provider at [facility] and she was instructed to take her atenolol dose early. Patient has a CHADS2VASC score of 2. She denies significant alcohol use but she does have snoring and daytime somnolence.   On follow up today, patient reports that she has not had any interim episodes of afib. She checks her smart watch on a fairly regular basis and it has shown only SR. She does occasionally have brief palpitations, consistent with known PVCs. She denies any bleeding issues on anticoagulation. She had an echo at [facility] with Dr Mina Marble and her LVH was unchanged.   Today, she denies symptoms of chest pain, shortness of breath, orthopnea, PND, lower extremity edema, presyncope, syncope, bleeding, or neurologic sequela. The patient is tolerating medications without difficulties and is otherwise without complaint today.    Atrial Fibrillation Risk Factors:  she does have symptoms or diagnosis of sleep apnea. she does not have a history of rheumatic fever. she does not have a history of alcohol use. The patient does not have a history of early familial atrial fibrillation or other arrhythmias.  she has a BMI of Body mass index is 37.93 kg/m.Marland Kitchen Filed Weights   12/06/21 1557  Weight: 109.9 kg     Family History  Problem  Relation Age of Onset   Hyperlipidemia Mother    Hypertension Mother    Heart attack Mother        May 2017, smoker   GI Bleed Mother        Aug. 2017   Diverticulitis Mother        Aug. 2017   Irritable bowel syndrome Mother    Hypertension Father    Colon polyps Father    Hypertension Sister    Heart disease Maternal Grandmother    Diabetes Mellitus I Maternal Grandmother    Heart attack Paternal Grandfather    Colon cancer Other        mat great uncle, dx in 83's   Breast cancer Maternal Aunt 38   Lung cancer Maternal Aunt 6   Cancer Sister 3       eye tumor/cancer- unk the name   Other Sister        Brain tumors   Esophageal cancer Neg Hx    Stomach cancer Neg Hx    Rectal cancer Neg Hx    Sudden Cardiac Death Neg Hx      Atrial Fibrillation Management history:  Previous antiarrhythmic drugs: none Previous cardioversions: none Previous ablations: none CHADS2VASC score: 2 Anticoagulation history: Eliquis   Past Medical History:  Diagnosis Date   Anxiety    Family history of breast cancer    GERD (gastroesophageal reflux disease)    Heart murmur    History of colonic polyps 05/27/2018   Hyperlipidemia    Hypertension    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     atenolol (TENORMIN) 25 MG tablet Take 25 mg by mouth every evening.     clonazePAM (KLONOPIN) 0.5 MG tablet Take 1 tablet (0.5 mg  total) by mouth 2 (two) times daily as needed for anxiety. 60 tablet 0   ELIQUIS 5 MG TABS tablet TAKE 1 TABLET BY MOUTH TWICE A DAY 60 tablet 11   esomeprazole (NEXIUM) 20 MG capsule Take 20 mg by mouth daily at 12 noon.     losartan (COZAAR) 50 MG tablet Take 50 mg by mouth every evening.     Omega-3 Fatty Acids (FISH OIL) 1000 MG CAPS Take by mouth.     PARoxetine (PAXIL) 30 MG tablet Take 2 tablets (60 mg total) by mouth daily. 180 tablet 1   rosuvastatin (CRESTOR) 20 MG tablet Take 20 mg by mouth daily.     atenolol (TENORMIN) 25 MG tablet Take 1 tablet (25 mg total) by mouth daily.     Current Facility-Administered Medications  Medication Dose Route Frequency Provider Last Rate Last Admin   0.9 %  sodium chloride infusion  500 mL Intravenous Continuous Nandigam, Kavitha V, MD       0.9 %  sodium chloride infusion  500 mL Intravenous Once Nandigam, Venia Minks, MD        Allergies  Allergen Reactions   Ciprofloxacin Rash    Social History   Socioeconomic History   Marital status: Married    Spouse name: Not on file   Number of children: 2   Years of education: Not on file   Highest education level: Not on file  Occupational History   Occupation: Pharmacist, hospital   Tobacco Use   Smoking status: Never   Smokeless tobacco: Never  Vaping Use   Vaping Use: Never used  Substance and Sexual Activity   Alcohol use: No   Drug use: No   Sexual activity: Not on file  Other Topics Concern   Not on file  Social History Narrative   Not on file   Social Determinants of Health   Financial Resource Strain: Not on file  Food Insecurity: Not on file  Transportation Needs: Not on file  Physical Activity: Not on file  Stress: Not on file  Social Connections: Not on file  Intimate Partner Violence: Not on file     ROS- All systems are reviewed and negative except as per the HPI above.  Physical Exam: Vitals:   12/06/21 1557  BP: (!) 150/72  Pulse: 64  Weight: 109.9 kg  Height: 5'  7" (1.702 m)    GEN- The patient is a well appearing obese female, alert and oriented x 3 today.   HEENT-head normocephalic, atraumatic, sclera clear, conjunctiva pink, hearing intact, trachea midline. Lungs- Clear to ausculation bilaterally, normal work of breathing Heart- Regular rate and rhythm, no rubs. 2/6 systolic murmur, S3 present GI- soft, NT, ND, + BS Extremities-  no clubbing, cyanosis, or edema MS- no significant deformity or atrophy Skin- no rash or lesion Psych- euthymic mood, full affect Neuro- strength and sensation are intact   Wt Readings from Last 3 Encounters:  12/06/21 109.9 kg  08/18/21 106.6 kg  06/02/21 107.1 kg    EKG today demonstrates  SR Vent. rate 64 BPM PR interval 154 ms QRS duration 94 ms QT/QTcB 418/431 ms  Echo 10/07/19 demonstrated  1. There is severe asymmetric hypertrophy of the basal septum. The LOVT gradient at rest by direct measurement is ~120 mmHG. Using the MR jet, a gradient of 115 mmg is estimated (assumed LA pressure of 15 mmHG). With valsalva the gradients do not appear   to increase but the signal is contaminated by MR. There is systolic  anterior motion of the mitral valve. Findings are consistent with  hypertrophic obstructive cardiomyopathy. Appears to be sigmoid variant. Would consider cardiac MR for better  characterization/quantification and LGE assessment.   2. Left ventricular ejection fraction, by visual estimation, is 60 to  65%. The left ventricle has normal function. There is severely increased left ventricular hypertrophy.   3. Left ventricular diastolic parameters are consistent with Grade I  diastolic dysfunction (impaired relaxation).   4. The left ventricle has no regional wall motion abnormalities.   5. Global right ventricle has normal systolic function.The right  ventricular size is normal. No increase in right ventricular wall  thickness.   6. Left atrial size was normal.   7. Right atrial size was normal.    8. Presence of pericardial fat pad.   9. Trivial pericardial effusion is present.  10. The mitral valve is degenerative. Mild mitral valve regurgitation.  11. The tricuspid valve is grossly normal. Tricuspid valve regurgitation  is trivial.  12. The aortic valve is tricuspid. Aortic valve regurgitation is not  visualized. No evidence of aortic valve sclerosis or stenosis.  13. The pulmonic valve was grossly normal. Pulmonic valve regurgitation is not visualized.  14. Mildly elevated pulmonary artery systolic pressure.  15. The tricuspid regurgitant velocity is 2.71 m/s, and with an assumed right atrial pressure of 3 mmHg, the estimated right ventricular systolic  pressure is mildly elevated at 32.4 mmHg.  16. The inferior vena cava is normal in size with greater than 50%  respiratory variability, suggesting right atrial pressure of 3 mmHg.   Epic records are reviewed at length today  CHA2DS2-VASc Score = 2  The patient's score is based upon: CHF History: 0 HTN History: 1 Diabetes History: 0 Stroke History: 0 Vascular Disease History: 0 Age Score: 0 Gender Score: 1       ASSESSMENT AND PLAN: 1. Paroxysmal Atrial Fibrillation (ICD10:  I48.0) The patient's CHA2DS2-VASc score is 2, indicating a 2.2% annual risk of stroke.   Patient appears to be maintaining SR. Continue Eliquis 5 mg BID Continue atenolol 25 mg daily. She has not tolerated higher doses of rate control due to bradycardia. Apple Watch for home monitoring.  2. Secondary Hypercoagulable State (ICD10:  D68.69) The patient is at significant risk for stroke/thromboembolism based upon her CHA2DS2-VASc Score of 2.  Continue Apixaban (Eliquis).   3. Obesity Body mass index is 37.93 kg/m. Lifestyle modification was discussed and encouraged including regular physical activity and weight reduction.  4. OSA CPAP titration competed 08/18/21  5. HOCM Followed by Dr Mina Marble at [facility] clinic.   6. HTN Stable, no  changes today.   Follow up in the AF clinic in one year.  [facility] [address] [place], [place] [address] [phone] 12/06/2021 4:34 PM

## 2021-12-12 ENCOUNTER — Telehealth: Payer: Self-pay | Admitting: Interventional Cardiology

## 2021-12-12 NOTE — Telephone Encounter (Signed)
Patient is calling back from the sleep study she had done. She states she has heard anything and is unsure of what the next step is supposed to be. Please advise

## 2021-12-13 ENCOUNTER — Telehealth: Payer: Self-pay | Admitting: Cardiology

## 2021-12-13 NOTE — Telephone Encounter (Signed)
Patient is calling in regards to CPAP. She states she has not heard or received anything from [facility]. Patient is requesting a callback to discuss what she is supposed to do from here. Please advise.

## 2021-12-15 NOTE — Telephone Encounter (Signed)
Please call [facility] at [phone] to get your cpap status. Ask for [name].

## 2021-12-15 NOTE — Telephone Encounter (Signed)
Please call [facility] at [phone] to get your cpap status. Ask for [name].

## 2022-01-03 IMAGING — MR MR CARD MORPHOLOGY WO/W CM
46 of 48 series · 46 of 48 positions shown · IV contrast (Gadavist)
Comparison: none

CLINICAL DATA: Hypertrophic Cardiomyopathy

EXAM:
CARDIAC MRI
TECHNIQUE: The patient was scanned on a 1.5 Tesla Siemens magnet. A dedicated
cardiac coil was used. Functional imaging was done using Fiesta
sequences. [DATE], and 4 chamber views were done to assess for RWMA's.
Modified Nkechi rule using a short axis stack was used to
calculate an ejection fraction on a dedicated work station using
Circle software. The patient received 12 cc of Gadavist after 10
minutes inversion recovery sequences were used to assess for
infiltration and scar tissue.
CONTRAST:  Gadavist

[Series 6: bSSFP · oblique · 8.0mm · 1.52mm/px · 1 of 25 slices shown (1 of 26)]
[im 1/25]
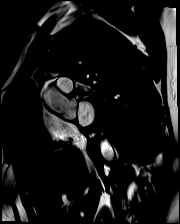

[Series 6: bSSFP · oblique · 8.0mm · 1.52mm/px · 1 of 25 slices shown (2 of 26)]
[im 1/25]
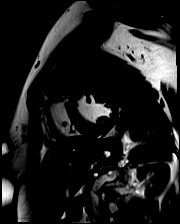

[Series 6: bSSFP · oblique · 8.0mm · 1.52mm/px · 1 of 25 slices shown (3 of 26)]
[im 1/25]
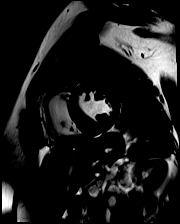

[Series 6: bSSFP · oblique · 8.0mm · 1.52mm/px · 1 of 25 slices shown (4 of 26)]
[im 1/25]
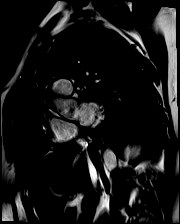

[Series 6: bSSFP · oblique · 8.0mm · 1.52mm/px · 1 of 25 slices shown (5 of 26)]
[im 1/25]
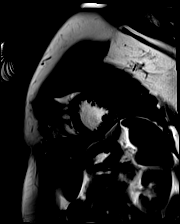

[Series 6: bSSFP · oblique · 8.0mm · 1.52mm/px · 1 of 25 slices shown (6 of 26)]
[im 1/25]
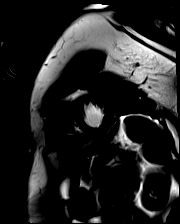

[Series 6: bSSFP · oblique · 8.0mm · 1.52mm/px · 1 of 25 slices shown (7 of 26)]
[im 1/25]
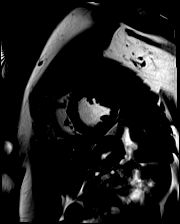

[Series 6: bSSFP · oblique · 8.0mm · 1.52mm/px · 1 of 25 slices shown (8 of 26)]
[im 1/25]
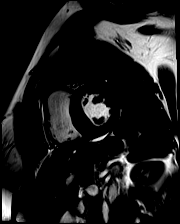

[Series 6: bSSFP · oblique · 8.0mm · 1.52mm/px · 1 of 25 slices shown (9 of 26)]
[im 1/25]
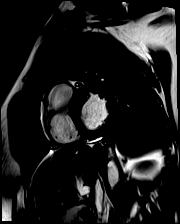

[Series 6: bSSFP · oblique · 8.0mm · 1.52mm/px · 1 of 25 slices shown (10 of 26)]
[im 1/25]
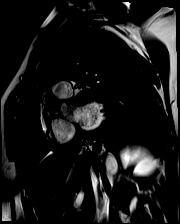

[Series 6: bSSFP · oblique · 8.0mm · 1.52mm/px · 1 of 25 slices shown (11 of 26)]
[im 1/25]
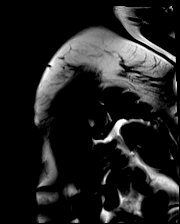

[Series 6: bSSFP · oblique · 8.0mm · 1.52mm/px · 1 of 25 slices shown (12 of 26)]
[im 1/25]
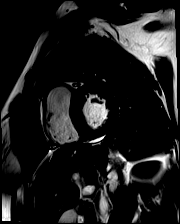

[Series 6: bSSFP · oblique · 8.0mm · 1.52mm/px · 1 of 25 slices shown (13 of 26)]
[im 1/25]
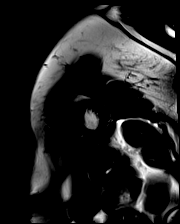

[Series 6: bSSFP · oblique · 8.0mm · 1.52mm/px · 1 of 25 slices shown (14 of 26)]
[im 1/25]
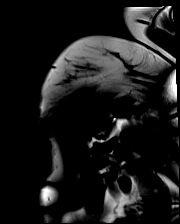

[Series 8: (id)_long_t1_moco · oblique · 8.0mm · 1.41mm/px · 1 of 24 slices shown]
[im 1/24]
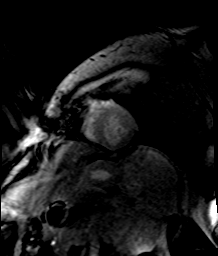

[Series 9: (id)_long_t1_moco_t1 · oblique · 8.0mm · 1.41mm/px · 1 of 5 slices shown]
[im 1/5]
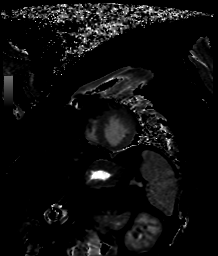

[Series 12: (id)_trufi_moco · oblique · 8.0mm · 1.88mm/px · 1 of 9 slices shown]
[im 1/9]
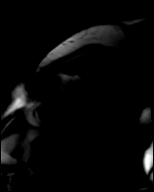

[Series 13: (id)_trufi_moco_t2 · oblique · 8.0mm · 1.88mm/px · 1 of 3 slices shown]
[im 1/3]
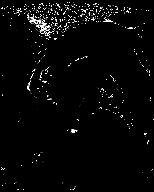

[Series 15: t2_stir_db_radial ((date)ch) · axial · 6.0mm · 1.73mm/px · 1 of 2 slices shown]
[im 1/2]
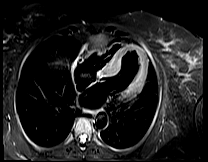

[Series 16: bSSFP · oblique · 6.0mm · 1.41mm/px · 1 of 25 slices shown (15 of 26)]
[im 1/25]
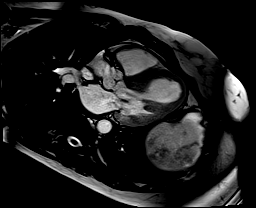

[Series 17: bSSFP · axial · 6.0mm · 1.41mm/px · 1 of 25 slices shown (16 of 26)]
[im 1/25]
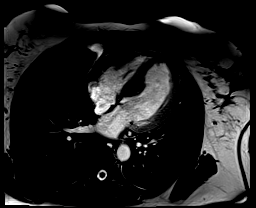

[Series 18: bSSFP · oblique · 6.0mm · 1.41mm/px · 1 of 25 slices shown (17 of 26)]
[im 1/25]
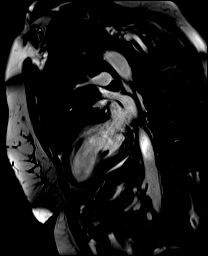

[Series 20: lge_single shot sa · oblique · 8.0mm · 1.77mm/px · 1 of 14 slices shown (1 of 2)]
[im 1/14]
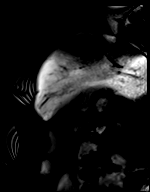

[Series 21: lge_single shot sa · oblique · 8.0mm · 1.77mm/px · 1 of 14 slices shown (2 of 2)]
[im 1/14]
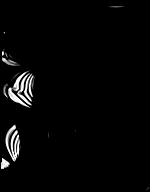

[Series 24: lge_single shot radial_mag · axial · 6.0mm · 1.98mm/px · 1 of 1 slices shown]
[im 1/1]
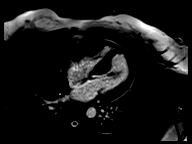

[Series 25: lge_single shot radial_psir · axial · 6.0mm · 1.98mm/px · 1 of 1 slices shown]
[im 1/1]
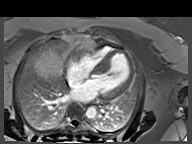

[Series 29: (id)_short_t1_moco · oblique · 8.0mm · 1.41mm/px · 1 of 27 slices shown]
[im 1/27]
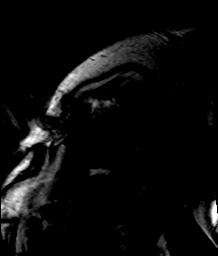

[Series 30: (id)_short_t1_moco_t1 · oblique · 8.0mm · 1.41mm/px · 1 of 6 slices shown]
[im 1/6]
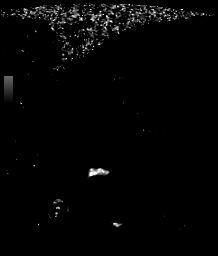

[Series 33: lge short axis_mag · oblique · 8.0mm · 1.52mm/px · 1 of 14 slices shown]
[im 1/14]
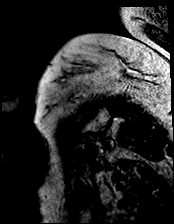

[Series 34: lge short axis_psir · oblique · 8.0mm · 1.52mm/px · 1 of 14 slices shown]
[im 1/14]
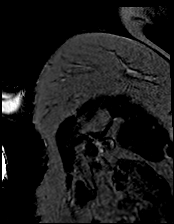

[Series 35: lge radial ((date)ch)_mag · axial · 6.0mm · 1.61mm/px · 1 of 1 slices shown]
[im 1/1]
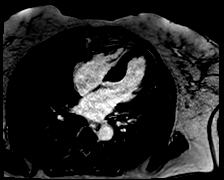

[Series 36: lge radial ((date)ch)_psir · axial · 6.0mm · 1.61mm/px · 1 of 1 slices shown]
[im 1/1]
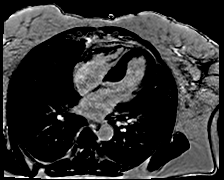

[Series 41: bSSFP · oblique · 8.0mm · 1.54mm/px · 1 of 15 slices shown (18 of 26)]
[im 1/15]
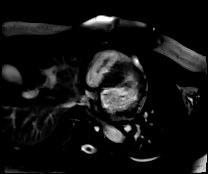

[Series 41: bSSFP · oblique · 8.0mm · 1.54mm/px · 1 of 15 slices shown (19 of 26)]
[im 1/15]
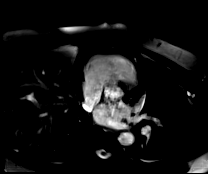

[Series 41: bSSFP · oblique · 8.0mm · 1.54mm/px · 1 of 15 slices shown (20 of 26)]
[im 1/15]
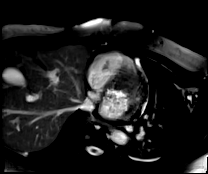

[Series 41: bSSFP · oblique · 8.0mm · 1.54mm/px · 1 of 15 slices shown (21 of 26)]
[im 1/15]
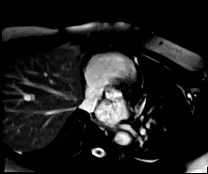

[Series 41: bSSFP · oblique · 8.0mm · 1.54mm/px · 1 of 15 slices shown (22 of 26)]
[im 1/15]
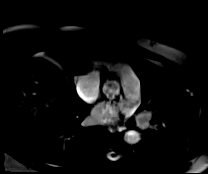

[Series 41: bSSFP · oblique · 8.0mm · 1.54mm/px · 1 of 15 slices shown (23 of 26)]
[im 1/15]
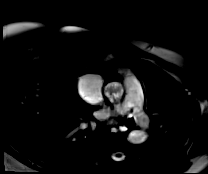

[Series 41: bSSFP · oblique · 8.0mm · 1.54mm/px · 1 of 15 slices shown (24 of 26)]
[im 1/15]
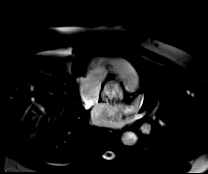

[Series 41: bSSFP · oblique · 8.0mm · 1.54mm/px · 1 of 15 slices shown (25 of 26)]
[im 1/15]
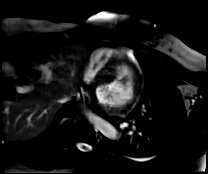

[Series 41: bSSFP · oblique · 8.0mm · 1.54mm/px · 1 of 15 slices shown (26 of 26)]
[im 1/15]
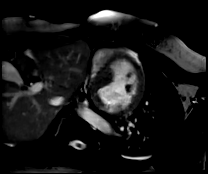

[Series 42: jet pc · oblique · 6.0mm · 1.73mm/px · 1 of 30 slices shown]
[im 1/30]
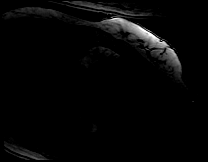

[Series 43: jet pc_mag · oblique · 6.0mm · 1.73mm/px · 1 of 25 slices shown]
[im 1/25]
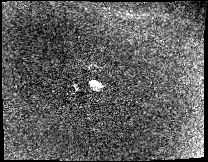

[Series 44: jet pc_p · oblique · 6.0mm · 1.73mm/px · 1 of 30 slices shown]
[im 1/30]
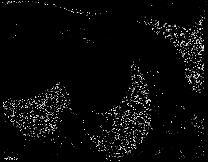

[Series 45: ao pc · oblique · 6.0mm · 1.73mm/px · 1 of 30 slices shown]
[im 1/30]
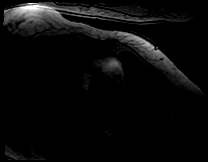

[Series 46: ao pc_mag · oblique · 6.0mm · 1.73mm/px · 1 of 30 slices shown]
[im 1/30]
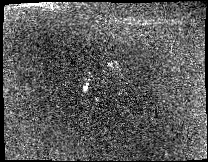

[46 of 48 positions shown; findings below may reference images not displayed]

FINDINGS: Mild LAE. Normal RA/RV size and function No ASD/PFO No pericardial
effusion Normal aortic root 3.2 cm. Mild LVE. Normal quantitative EF
74% (EDV 115 cc ESV 30 cc SV 86 cc) Morphologically patient has
hypertrophic cardiomyopathy. There is severe basal septal
hypertrophy 21 mm compared to posterior wall 11 mm. There is an
elongated anterior mitral leaflet with MAITI. The LVOT gradient
actually starts with abutment of a large hypertrophied and
anteriorly displaced papillary muscle There is mild associated
mitral regurgitation. The aortic valve itself was not well seen in
series obtained but appears tri leaflet with some leaflet thickening
and no stenosis in particular at valve level.

Advanced measurement of high risk features were negative. There was
no delayed gadolinium uptake on inversion recovery sequences. The
global T1 time (3677 msec) and the T1 time in septum (2500) are
mildly increased. The T2 time in septum and posterior wall are
normal 40-51 msec. The calculated ECV is in normal range as well 19%
using a Hct of 39
IMPRESSION: 1. Mild LVE with severe asymmetric basal septal hypertrophy
measuring 21 mm. Normal LVEF 74%

2. MAITI of anterior mitral valve leaflet with LVOT gradient created
by anterior mitral leaflet and large hypertrophied anteriorly
displaced papillary muscle Suggest echo correlation

3.  Mild MR

4.  Mild LAE

5. No high risk features other than septal thickness with no delayed
gadolinium uptake in septum, mildly prolonged T1 times and normal T2
and ECV

Gee Wylie

## 2022-02-13 ENCOUNTER — Other Ambulatory Visit: Payer: Self-pay | Admitting: Interventional Cardiology

## 2022-02-14 NOTE — Telephone Encounter (Signed)
Pt is still being seen in the A-Fib clinic, pt has not been released. Please address ?

## 2022-03-02 ENCOUNTER — Other Ambulatory Visit: Payer: Self-pay | Admitting: Psychiatry

## 2022-03-02 DIAGNOSIS — F410 Panic disorder [episodic paroxysmal anxiety] without agoraphobia: Secondary | ICD-10-CM

## 2022-03-02 DIAGNOSIS — F411 Generalized anxiety disorder: Secondary | ICD-10-CM

## 2022-04-20 ENCOUNTER — Other Ambulatory Visit: Payer: Self-pay | Admitting: Obstetrics and Gynecology

## 2022-04-20 DIAGNOSIS — Z1231 Encounter for screening mammogram for malignant neoplasm of breast: Secondary | ICD-10-CM

## 2022-04-28 ENCOUNTER — Ambulatory Visit: Payer: BC Managed Care – PPO

## 2022-05-01 ENCOUNTER — Other Ambulatory Visit (HOSPITAL_COMMUNITY): Payer: Self-pay | Admitting: Physician Assistant

## 2022-05-05 ENCOUNTER — Ambulatory Visit
Admission: RE | Admit: 2022-05-05 | Discharge: 2022-05-05 | Disposition: A | Discharge status: Home / Self Care | Payer: BC Managed Care – PPO | Source: Ambulatory Visit | Attending: Obstetrics and Gynecology | Admitting: Obstetrics and Gynecology

## 2022-05-05 DIAGNOSIS — Z1231 Encounter for screening mammogram for malignant neoplasm of breast: Secondary | ICD-10-CM

## 2022-05-31 ENCOUNTER — Other Ambulatory Visit: Payer: Self-pay | Admitting: Psychiatry

## 2022-05-31 DIAGNOSIS — F411 Generalized anxiety disorder: Secondary | ICD-10-CM

## 2022-05-31 DIAGNOSIS — F410 Panic disorder [episodic paroxysmal anxiety] without agoraphobia: Secondary | ICD-10-CM

## 2022-06-01 ENCOUNTER — Encounter: Payer: Self-pay | Admitting: Psychiatry

## 2022-06-01 ENCOUNTER — Ambulatory Visit: Payer: BC Managed Care – PPO | Admitting: Psychiatry

## 2022-06-01 ENCOUNTER — Other Ambulatory Visit: Payer: Self-pay | Admitting: Psychiatry

## 2022-06-01 DIAGNOSIS — F411 Generalized anxiety disorder: Secondary | ICD-10-CM

## 2022-06-01 DIAGNOSIS — F410 Panic disorder [episodic paroxysmal anxiety] without agoraphobia: Secondary | ICD-10-CM

## 2022-06-01 MED ORDER — PAROXETINE HCL 30 MG PO TABS: 60.0000 mg | ORAL_TABLET | Freq: Every day | ORAL | 3 refills | Status: DC

## 2022-06-01 NOTE — Telephone Encounter (Signed)
[name] called back after her appt today with Dr. Clovis Pu.  He told her to let him know where to call in the Clonazepam.  [facility] said [facility] [address] [facility] [employer].  She would like it sent there.  She said she would also like her Paxil script sent [facility] instead of [facility] (it's not time for it yet).  Next appt 06/04/2023

## 2022-06-01 NOTE — Telephone Encounter (Signed)
fyi

## 2022-06-01 NOTE — Progress Notes (Signed)
[name] [id number] April 03, 1971 51 y.o.  Subjective:   Patient ID:  [name] is a 51 y.o. (DOB 30-Sep-1971) female.  Chief Complaint:  Chief Complaint  Patient presents with   Follow-up   Anxiety    Anxiety Symptoms include palpitations. Patient reports no chest pain, dizziness or shortness of breath.     [name] presents to the office today for follow-up of generalized anxiety disorder and panic disorder without agoraphobia.  Anxiety worse since January 2020.  She was first seen on May 18, 2020.  At that time she was taking sertraline 150 mg daily for 2 months without much change.  She was also taking a low-dose of clonazepam.   She agreed to cross taper to paroxetine 40 mg and to give it at least 8 weeks at that dose to get improved response.  Typically paroxetine is more effective for panic than is Lexapro she had taken in the past with buspirone.  Also just just suggested increasing buspirone to 30 mg twice daily to maximize response.  seen January 15, 2020.  Following was noted: NR anxiety with paroxetine 40 for 6-7 weeks plus buspirone 30 BID.  Tolerated. Increase paroxetine 60 mg daily. Schedule Klonopin 0.25 mg BID bc morning anxiety bc awakening with anxiety.  February 19, 1999 2100 appointment, the following is noted: Increased paroxetine 60 mg daily and Klonopin 0.5 mg daily since April 10. OK, but not as well as I want to.  Still needs the clonazepam.    Maybe some benefit from the increase paroxetine.   Crying spells for no reason lately.  Frustrated.  Still gets rushes of anxity with numbness tingling, butterflies in the stomach.  Eating is fine. But still anxiety is worse in the morning. Plan was to start buspirone due to limited response.  03/25/2020 appointment with the following noted: Better with less anxiety and now not having to take Klonopin daily.  Last taken it Saturday. No recent panic and anxiety is generally managed.    Tolerating paroxetine 60 mg daily.  Plan: worked well so no change  07/07/20 appt with the following noted: Pretty good usually.  Overall OK but anxious today without reason and took extra clonazepam.   Not markedly depressed.  Worries over taking clonazepam. Felt she should get more out of 60 mg paroxetine.  More anxious on weekends fiirst thing. No SE paroxetine. Average clonopin is reduced to just every 3 weeks. Lately not enough sleep.   A little more anxious about heart condition. Plan: No med changes  11/03/2020 appointment with following noted: Still on paroxetine 60.  Clonazepam a little more often than she'd like 3 times weekly at 1/2 tablet over the last 3 weeks.  Sunday woke up with anxiety and needed more.  Tolerates it well.  No panic.  With anxiety stomach gets queasy and tingling in body and generally nervous and sometimes emotional with it out of frustration. Drives me crazy that did so well for so long on just paroxetine 20.  Doesn't feel normal entirely in the last 3 years after going to coworkers funeral that triggered this. Plan: No med changes  03/03/2021 appointment with the following noted: Tough 4 mos mainly related to F's unexpected death. Anxiety wise got through it OK.  That week daily clonazepam.  A little scare for herself with afib end of March corrected. Ongoing cardiac issues.  No excess clonazepam. Parents live 10 min away and helping mother more. Occ flareups of anxiety that  pass.   Plan: continue paroxetine 60 and clonazepam 0.5 mg BID prn  09/01/2021 appointment with the following noted: Infrequent clonazepam 0.25 mg prn. Anxiety fluctuates with more in AUG DT stressors and better since then. Sleep is good.   SE none. No afib since here but still palpitations.   Empty nesters.   Plan no med changes  06/01/22  appt noted:  Continues paroxetine 60 and clonazepam 0.5 mg BID prn.  Not often and when used is 0.25 mg prn. Peach clonazepam does not work  as well (Accord).   Yellow is better. Changing grade levels at school.  Didn't want to do it.   No panic in a year.  Anxiety usually under control.   Patient reports stable mood and denies depressed or irritable moods.    Patient denies difficulty with sleep initiation or maintenance. Denies appetite disturbance.  Patient reports that energy and motivation have been good.  Patient denies any difficulty with concentration.  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  Past Psychiatric Medication Trials: Sertraline 150 no response but inadequate duration Lexapro 30 with buspirone 20 twice daily with some response Paroxetine 60 (took 20 mg for years without relapse) NR anxiety with paroxetine 40 for 6-7 weeks plus buspirone 30 BID. Buspirone 30 BID Low-dose clonazepam (Accord less effective than yellow one)  Review of Systems:  Review of Systems  Respiratory:  Negative for chest tightness and shortness of breath.   Cardiovascular:  Positive for palpitations. Negative for chest pain.  Neurological:  Negative for dizziness and tremors.   DX HYPERTROPHIC OBSTRUCTIVE CARDIOMYOPATHY  Medications: I have reviewed the patient's current medications.  Current Outpatient Medications  Medication Sig Dispense Refill   Ascorbic Acid (VITAMIN C) 1000 MG tablet Take 1,000 mg by mouth daily.     atenolol (TENORMIN) 25 MG tablet Take 1 tablet (25 mg total) by mouth daily.     atenolol (TENORMIN) 25 MG tablet Take 1 tablet (25 mg total) by mouth daily. 90 tablet 3   clonazePAM (KLONOPIN) 0.5 MG tablet Take 1 tablet (0.5 mg total) by mouth 2 (two) times daily as needed for anxiety. 60 tablet 0   ELIQUIS 5 MG TABS tablet TAKE 1 TABLET BY MOUTH TWICE A DAY 60 tablet 11   esomeprazole (NEXIUM) 20 MG capsule Take 20 mg by mouth daily at 12 noon.     losartan (COZAAR) 50 MG tablet Take 50 mg by mouth every evening.     Omega-3 Fatty Acids (FISH OIL) 1000 MG CAPS Take by mouth.     rosuvastatin  (CRESTOR) 20 MG tablet Take 20 mg by mouth daily.     PARoxetine (PAXIL) 30 MG tablet Take 2 tablets (60 mg total) by mouth daily. 180 tablet 3   Current Facility-Administered Medications  Medication Dose Route Frequency Provider Last Rate Last Admin   0.9 %  sodium chloride infusion  500 mL Intravenous Continuous Nandigam, Kavitha V, MD       0.9 %  sodium chloride infusion  500 mL Intravenous Once Nandigam, Venia Minks, MD        Medication Side Effects: None  Allergies:  Allergies  Allergen Reactions   Ciprofloxacin Rash    Past Medical History:  Diagnosis Date   Anxiety    Family history of breast cancer    GERD (gastroesophageal reflux disease)    Heart murmur    History of colonic polyps 05/27/2018   Hyperlipidemia    Hypertension    Hypertrophic  obstructive cardiomyopathy 10/2019   Echocardiogram 10/2019: LVOT 120 mmHg // cMRI 10/2019: basal septum 21 mm, + SAM, no high risk features (no LGE; T2, ECV normal) // ETT 10/2019: no ischemic ST changes, normal BP response, no exercise induced VT // Cardiac monitor 11/2019: no ventricular arrhythmias    Panic disorder     Family History  Problem Relation Age of Onset   Hyperlipidemia Mother    Hypertension Mother    Heart attack Mother        May 2017, smoker   GI Bleed Mother        Aug. 2017   Diverticulitis Mother        Aug. 2017   Irritable bowel syndrome Mother    Hypertension Father    Colon polyps Father    Hypertension Sister    Heart disease Maternal Grandmother    Diabetes Mellitus I Maternal Grandmother    Heart attack Paternal Grandfather    Colon cancer Other        mat great uncle, dx in 85's   Breast cancer Maternal Aunt 60   Lung cancer Maternal Aunt 76   Cancer Sister 3       eye tumor/cancer- unk the name   Other Sister        Brain tumors   Esophageal cancer Neg Hx    Stomach cancer Neg Hx    Rectal cancer Neg Hx    Sudden Cardiac Death Neg Hx     Social History   Socioeconomic History    Marital status: Married    Spouse name: Not on file   Number of children: 2   Years of education: Not on file   Highest education level: Not on file  Occupational History   Occupation: Pharmacist, hospital   Tobacco Use   Smoking status: Never   Smokeless tobacco: Never  Vaping Use   Vaping Use: Never used  Substance and Sexual Activity   Alcohol use: No   Drug use: No   Sexual activity: Not on file  Other Topics Concern   Not on file  Social History Narrative   Not on file   Social Determinants of Health   Financial Resource Strain: Not on file  Food Insecurity: Not on file  Transportation Needs: Not on file  Physical Activity: Not on file  Stress: Not on file  Social Connections: Not on file  Intimate Partner Violence: Not on file    Past Medical History, Surgical history, Social history, and Family history were reviewed and updated as appropriate.   Please see review of systems for further details on the patient's review from today.   Objective:   Physical Exam:  There were no vitals taken for this visit.  Physical Exam Constitutional:      General: She is not in acute distress. Musculoskeletal:        General: No deformity.  Neurological:     Mental Status: She is alert and oriented to person, place, and time.     Coordination: Coordination normal.  Psychiatric:        Attention and Perception: Attention and perception normal. She does not perceive auditory or visual hallucinations.        Mood and Affect: Mood is anxious. Mood is not depressed. Affect is not labile, blunt, angry, tearful or inappropriate.        Speech: Speech normal.        Behavior: Behavior normal.        Thought Content:  Thought content normal. Thought content is not paranoid or delusional. Thought content does not include homicidal or suicidal ideation. Thought content does not include suicidal plan.        Cognition and Memory: Cognition and memory normal.        Judgment: Judgment normal.      Comments: Insight intact      Lab Review:     Component Value Date/Time   NA 137 03/02/2021 1541   NA 139 10/07/2019 0903   K 3.9 03/02/2021 1541   CL 105 03/02/2021 1541   CO2 27 03/02/2021 1541   GLUCOSE 89 03/02/2021 1541   BUN 16 03/02/2021 1541   BUN 13 10/07/2019 0903   CREATININE 0.90 03/02/2021 1541   CALCIUM 9.2 03/02/2021 1541   PROT 6.3 10/07/2019 0903   ALBUMIN 4.1 10/07/2019 0903   AST 17 10/07/2019 0903   ALT 10 10/07/2019 0903   ALKPHOS 42 10/07/2019 0903   BILITOT 0.8 10/07/2019 0903   GFRNONAA >60 03/02/2021 1541   GFRAA 85 10/07/2019 0903       Component Value Date/Time   WBC 5.6 03/02/2021 1541   RBC 4.27 03/02/2021 1541   HGB 12.5 03/02/2021 1541   HCT 37.4 03/02/2021 1541   PLT 174 03/02/2021 1541   MCV 87.6 03/02/2021 1541   MCH 29.3 03/02/2021 1541   MCHC 33.4 03/02/2021 1541   RDW 12.9 03/02/2021 1541    No results found for: "POCLITH", "LITHIUM"   No results found for: "PHENYTOIN", "PHENOBARB", "VALPROATE", "CBMZ"   .res Assessment: Plan:    Makilah was seen today for follow-up and anxiety.  Diagnoses and all orders for this visit:  Generalized anxiety disorder -     PARoxetine (PAXIL) 30 MG tablet; Take 2 tablets (60 mg total) by mouth daily.  Panic disorder without agoraphobia -     PARoxetine (PAXIL) 30 MG tablet; Take 2 tablets (60 mg total) by mouth daily.  DX HYPERTROPHIC OBSTRUCTIVE CARDIOMYOPATHY   NR anxiety with paroxetine 40 for 6-7 weeks plus buspirone 30 BID.  Tolerated. However paroxetine 60 mg daily worked pretty well after 6-8 weeks.. Disc SE  Disc SE in detail and SSRI withdrawal sx. She has dizziness as part of the heart condition.  She has been able to reduce the clonazepam from scheduled to as needed and has not needed to take it nearly as often.  No med changes are indicated.  But she gets frustrated at having to use it bc feels like a failure. Diusc differences in generics noticeable at low doses.  She  doesn't like Accord as much.  Option abilify potentiation if needed.  We discussed the short-term risks associated with benzodiazepines including sedation and increased fall risk among others.  Discussed long-term side effect risk including dependence, potential withdrawal symptoms, and the potential eventual dose-related risk of dementia.  But recent studies from 2020 dispute this association between benzodiazepines and dementia risk. Newer studies in 2020 do not support an association with dementia.  FU 12 mos  Lynder Parents, MD, DFAPA '  Please see After Visit Summary for patient specific instructions.  No future appointments.   No orders of the defined types were placed in this encounter.   -------------------------------

## 2022-06-07 MED ORDER — CLONAZEPAM 0.5 MG PO TABS: 0.5000 mg | ORAL_TABLET | Freq: Two times a day (BID) | ORAL | 5 refills | Status: DC | PRN

## 2022-06-07 NOTE — Telephone Encounter (Signed)
Pt called and said that [facility] [address] Please send it in

## 2022-06-13 ENCOUNTER — Other Ambulatory Visit: Payer: Self-pay | Admitting: Psychiatry

## 2022-06-13 DIAGNOSIS — F410 Panic disorder [episodic paroxysmal anxiety] without agoraphobia: Secondary | ICD-10-CM

## 2022-06-13 DIAGNOSIS — F411 Generalized anxiety disorder: Secondary | ICD-10-CM

## 2022-06-13 MED ORDER — CLONAZEPAM 0.5 MG PO TABS: 0.5000 mg | ORAL_TABLET | Freq: Two times a day (BID) | ORAL | 5 refills | Status: DC | PRN

## 2022-06-13 NOTE — Telephone Encounter (Signed)
In future pend this to make it faster for me

## 2022-08-16 ENCOUNTER — Telehealth: Payer: Self-pay | Admitting: Cardiology

## 2022-08-16 ENCOUNTER — Telehealth (HOSPITAL_COMMUNITY): Payer: Self-pay

## 2022-08-16 NOTE — Telephone Encounter (Signed)
Patient called in regarding a left side nosebleed. Her pillow was soaked with blood. Started this morning at 5:15am and lasted until 8:45am. She has been using Afrin nasal spray. She contact her cardiologist Dr. Tamala Julian and he instructed her not to take the Eliquis today and to resume tomorrow evening . Consulted with Adline Peals PA and he agreed with Dr. Tamala Julian as well. Her nose has been real dry. She sleeps with a fan in her face at night. Advised patient to use Saline spray, Saline gel and a humidifier. She was told the Saline spray and Saline gel should help moisturize her nose. Consulted with patient and she verbalized understanding. She will contact the clinic back if she has further concerns.

## 2022-08-16 NOTE — Telephone Encounter (Signed)
Patient called in reporting a left sided nose bleed that started this morning around 5:30am. Woke up to large blood spot on her pillow. Sleeps with a fan, air has been dry. Had tried holding pressure and ice pack but still bleeding. While on the phone she reported a large clot fell from her nose. I advised to try Afrin spray in addition to the manual pressure and ice along with keeping tissue packing in the nose. If she continues to bleed would need to be seen in UC/ED for potential nasal packing. Advised ok to hold Eliquis today and resume tomorrow evening if resolved. Use humidifier in the home. She voiced understanding and thanked me for call back.

## 2022-08-21 ENCOUNTER — Encounter (HOSPITAL_COMMUNITY): Payer: Self-pay

## 2022-12-07 ENCOUNTER — Ambulatory Visit (HOSPITAL_COMMUNITY)
Admission: RE | Admit: 2022-12-07 | Discharge: 2022-12-07 | Disposition: A | Discharge status: Home / Self Care | Payer: BC Managed Care – PPO | Source: Ambulatory Visit | Attending: Physician Assistant | Admitting: Physician Assistant

## 2022-12-07 ENCOUNTER — Encounter (HOSPITAL_COMMUNITY): Payer: Self-pay | Admitting: Physician Assistant

## 2022-12-07 VITALS — BP 142/80 | HR 73 | Ht 67.0 in | Wt 244.6 lb

## 2022-12-07 DIAGNOSIS — I272 Pulmonary hypertension, unspecified: Secondary | ICD-10-CM | POA: Insufficient documentation

## 2022-12-07 DIAGNOSIS — D6869 Other thrombophilia: Secondary | ICD-10-CM | POA: Diagnosis not present

## 2022-12-07 DIAGNOSIS — Z7901 Long term (current) use of anticoagulants: Secondary | ICD-10-CM | POA: Diagnosis not present

## 2022-12-07 DIAGNOSIS — E669 Obesity, unspecified: Secondary | ICD-10-CM | POA: Insufficient documentation

## 2022-12-07 DIAGNOSIS — Z6838 Body mass index (BMI) 38.0-38.9, adult: Secondary | ICD-10-CM | POA: Diagnosis not present

## 2022-12-07 DIAGNOSIS — I421 Obstructive hypertrophic cardiomyopathy: Secondary | ICD-10-CM | POA: Insufficient documentation

## 2022-12-07 DIAGNOSIS — I480 Paroxysmal atrial fibrillation: Secondary | ICD-10-CM | POA: Diagnosis present

## 2022-12-07 DIAGNOSIS — G4733 Obstructive sleep apnea (adult) (pediatric): Secondary | ICD-10-CM | POA: Diagnosis not present

## 2022-12-07 NOTE — Progress Notes (Signed)
Primary Care Physician: Marda Stalker, PA-C Primary Cardiologist: Denise Tamala Julian Primary Electrophysiologist: none Referring Physician: Dr Smith/[facility] triage  HOCM Clinic: [name] is a 52 y.o. female with a history of HOCM, HTN, atrial fibrillation who presents for follow up in the [facility].  The patient was initially diagnosed with atrial fibrillation 01/18/21 on her Apple Watch (reviewed by Denise Tamala Julian and personally reviewed) with symptoms of tachypalpitations and intermittent lightheadedness. She reports she was in and out of afib for about 4 hours. There were no specific triggers she could identify. She call the on call provider at [facility] and she was instructed to take her atenolol dose early. Patient has a CHADS2VASC score of 2. She denies significant alcohol use but she does have snoring and daytime somnolence.   On follow up today, patient reports that she has done well since her last visit. She had not had any interim episodes of afib. She denies any bleeding issues on anticoagulation. She had a visit with [name] at [facility] in November and her gradients and worsened with her [facility]. She is following up with him in March.   Today, she denies symptoms of palpitations, chest pain, shortness of breath, orthopnea, PND, lower extremity edema, presyncope, syncope, bleeding, or neurologic sequela. The patient is tolerating medications without difficulties and is otherwise without complaint today.    Atrial Fibrillation Risk Factors:  she does have symptoms or diagnosis of sleep apnea. she does not have a history of rheumatic fever. she does not have a history of alcohol use. The patient does not have a history of early familial atrial fibrillation or other arrhythmias.  she has a BMI of Body mass index is 38.31 kg/m.Marland Kitchen Filed Weights   12/07/22 1555  Weight: 110.9 kg    Family History  Problem Relation Age of Onset    Hyperlipidemia Mother    Hypertension Mother    Heart attack Mother        May 2017, smoker   GI Bleed Mother        Aug. 2017   Diverticulitis Mother        Aug. 2017   Irritable bowel syndrome Mother    Hypertension Father    Colon polyps Father    Hypertension Sister    Heart disease Maternal Grandmother    Diabetes Mellitus I Maternal Grandmother    Heart attack Paternal Grandfather    Colon cancer Other        mat great uncle, dx in 73's   Breast cancer Maternal Aunt 76   Lung cancer Maternal Aunt 32   Cancer Sister 3       eye tumor/cancer- unk the name   Other Sister        Brain tumors   Esophageal cancer Neg Hx    Stomach cancer Neg Hx    Rectal cancer Neg Hx    Sudden Cardiac Death Neg Hx      Atrial Fibrillation Management history:  Previous antiarrhythmic drugs: none Previous cardioversions: none Previous ablations: none CHADS2VASC score: 2 Anticoagulation history: Eliquis   Past Medical History:  Diagnosis Date   Anxiety    Family history of breast cancer    GERD (gastroesophageal reflux disease)    Heart murmur    History of colonic polyps 05/27/2018   Hyperlipidemia    Hypertension    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     atenolol (TENORMIN) 25 MG tablet Take 1 tablet (25 mg total) by mouth daily. 90 tablet 3   clonazePAM (KLONOPIN) 0.5 MG tablet Take 1 tablet (0.5 mg total)  by mouth 2 (two) times daily as needed for anxiety. 60 tablet 5   ELIQUIS 5 MG TABS tablet TAKE 1 TABLET BY MOUTH TWICE A DAY 60 tablet 11   esomeprazole (NEXIUM) 20 MG capsule Take 20 mg by mouth daily at 12 noon.     losartan (COZAAR) 100 MG tablet Take 100 mg by mouth daily.     Omega-3 Fatty Acids (FISH OIL) 1000 MG CAPS Take by mouth.     PARoxetine (PAXIL) 30 MG tablet Take 2 tablets (60 mg total) by mouth daily. 180 tablet 3   rosuvastatin (CRESTOR) 20 MG tablet Take 20 mg by mouth daily.     Current Facility-Administered Medications  Medication Dose Route Frequency Provider Last Rate Last Admin   0.9 %  sodium chloride infusion  500 mL Intravenous Continuous Valdez, Denise V, MD       0.9 %  sodium chloride infusion  500 mL Intravenous Once Valdez, Denise Minks, MD        Allergies  Allergen Reactions   Ciprofloxacin Rash    Social History   Socioeconomic History   Marital status: Married    Spouse name: Not on file   Number of children: 2   Years of education: Not on file   Highest education level: Not on file  Occupational History   Occupation: teacher   Tobacco Use   Smoking status: Never   Smokeless tobacco: Never   Tobacco comments:    Never smoke 12/07/22  Vaping Use   Vaping Use: Never used  Substance and Sexual Activity   Alcohol use: No   Drug use: No   Sexual activity: Not on file  Other Topics Concern   Not on file  Social History Narrative   Not on file   Social Determinants of Health   Financial Resource Strain: Not on file  Food Insecurity: Not on file  Transportation Needs: Not on file  Physical Activity: Not on file  Stress: Not on file  Social Connections: Not on file  Intimate Partner Violence: Not on file     ROS- All systems are reviewed and negative except as per the HPI above.  Physical Exam: Vitals:   12/07/22 1555  BP: (!) 142/80  Pulse: 73  Weight: 110.9 kg  Height: 5' 7"$  (1.702 m)    GEN- The patient is a well  appearing obese female, alert and oriented x 3 today.   HEENT-head normocephalic, atraumatic, sclera clear, conjunctiva pink, hearing intact, trachea midline. Lungs- Clear to ausculation bilaterally, normal work of breathing Heart- Regular rate and rhythm, no rubs or gallops, 3/6 systolic murmur  GI- soft, NT, ND, + BS Extremities- no clubbing, cyanosis, or edema MS- no significant deformity  or atrophy Skin- no rash or lesion Psych- euthymic mood, full affect Neuro- strength and sensation are intact   Wt Readings from Last 3 Encounters:  12/07/22 110.9 kg  12/06/21 109.9 kg  08/18/21 106.6 kg    EKG today demonstrates  SR Vent. rate 73 BPM PR interval 152 ms QRS duration 96 ms QT/QTcB 408/449 ms  Echo 10/07/19 demonstrated  1. There is severe asymmetric hypertrophy of the basal septum. The LOVT gradient at rest by direct measurement is ~120 mmHG. Using the MR jet, a gradient of 115 mmg is estimated (assumed LA pressure of 15 mmHG). With valsalva the gradients do not appear to increase but the signal is contaminated by MR. There is systolic anterior motion of the mitral valve. Findings are consistent with hypertrophic obstructive cardiomyopathy. Appears to be sigmoid variant. Would consider cardiac MR for better  characterization/quantification and LGE assessment.   2. Left ventricular ejection fraction, by visual estimation, is 60 to  65%. The left ventricle has normal function. There is severely increased left ventricular hypertrophy.   3. Left ventricular diastolic parameters are consistent with Grade I  diastolic dysfunction (impaired relaxation).   4. The left ventricle has no regional wall motion abnormalities.   5. Global right ventricle has normal systolic function.The right  ventricular size is normal. No increase in right ventricular wall  thickness.   6. Left atrial size was normal.   7. Right atrial size was normal.   8. Presence of pericardial fat pad.   9. Trivial  pericardial effusion is present.  10. The mitral valve is degenerative. Mild mitral valve regurgitation.  11. The tricuspid valve is grossly normal. Tricuspid valve regurgitation  is trivial.  12. The aortic valve is tricuspid. Aortic valve regurgitation is not  visualized. No evidence of aortic valve sclerosis or stenosis.  13. The pulmonic valve was grossly normal. Pulmonic valve regurgitation is not visualized.  14. Mildly elevated pulmonary artery systolic pressure.  15. The tricuspid regurgitant velocity is 2.71 m/s, and with an assumed right atrial pressure of 3 mmHg, the estimated right ventricular systolic  pressure is mildly elevated at 32.4 mmHg.  16. The inferior vena cava is normal in size with greater than 50%  respiratory variability, suggesting right atrial pressure of 3 mmHg.   Epic records are reviewed at length today  CHA2DS2-VASc Score = 2  The patient's score is based upon: CHF History: 0 HTN History: 1 Diabetes History: 0 Stroke History: 0 Vascular Disease History: 0 Age Score: 0 Gender Score: 1        ASSESSMENT AND PLAN: 1. Paroxysmal Atrial Fibrillation (ICD10:  I48.0) The patient's CHA2DS2-VASc score is 2, indicating a 2.2% annual risk of stroke.   Patient appears to be maintaining SR. Continue Eliquis 5 mg BID Continue atenolol 25 mg daily. She has not tolerated higher doses of rate control due to bradycardia. Apple Watch for home monitoring.  2. Secondary Hypercoagulable State (ICD10:  D68.69) The patient is at significant risk for stroke/thromboembolism based upon her CHA2DS2-VASc Score of 2.  Continue Apixaban (Eliquis).   3. Obesity Body mass index is 38.31 kg/m. Lifestyle modification was discussed and encouraged including regular physical activity and weight reduction.  4. OSA Encouraged compliance with CPAP therapy.   5. HOCM Followed by [name] at [facility].   6. HTN Stable, no changes today.   Follow up in the AF clinic as  needed. Will refer to establish care with a new cardiologist given Denise Thompson Caul  retirement. Will refer to Denise Gasper Sells.    [facility] [address] [place], [place] [address] [phone] 12/07/2022 4:06 PM

## 2023-02-22 ENCOUNTER — Encounter: Payer: Self-pay | Admitting: Internal Medicine

## 2023-02-22 ENCOUNTER — Ambulatory Visit: Payer: BC Managed Care – PPO | Attending: Internal Medicine | Admitting: Internal Medicine

## 2023-02-22 VITALS — BP 125/77 | HR 71 | Ht 67.0 in | Wt 248.0 lb

## 2023-02-22 DIAGNOSIS — I480 Paroxysmal atrial fibrillation: Secondary | ICD-10-CM | POA: Diagnosis not present

## 2023-02-22 DIAGNOSIS — D6869 Other thrombophilia: Secondary | ICD-10-CM

## 2023-02-22 DIAGNOSIS — E785 Hyperlipidemia, unspecified: Secondary | ICD-10-CM | POA: Diagnosis not present

## 2023-02-22 DIAGNOSIS — I421 Obstructive hypertrophic cardiomyopathy: Secondary | ICD-10-CM | POA: Diagnosis not present

## 2023-02-22 NOTE — Patient Instructions (Signed)
Medication Instructions:  Your physician recommends that you continue on your current medications as directed. Please refer to the Current Medication list given to you today.  *If you need a refill on your cardiac medications before your next appointment, please call your pharmacy*   Lab Work: Fasting lipid panel (nothing to eat or drink 8-12 hours before except water and black coffee)  If you have labs (blood work) drawn today and your tests are completely normal, you will receive your results only by: MyChart Message (if you have MyChart) OR A paper copy in the mail If you have any lab test that is abnormal or we need to change your treatment, we will call you to review the results.   Testing/Procedures: Your physician has ordered you to have non-invasive CT for Calcium Scoring of your heart. It will calculate your risk of developing Coronary Artery Disease (CAD) by measuring the amount of buildup of calcium in the plaque in the coronary arteries (arteries surrounding your heart).     Follow-Up: At [facility], you and your health needs are our priority.  As part of our continuing mission to provide you with exceptional heart care, we have created designated Provider Care Teams.  These Care Teams include your primary Cardiologist (physician) and Advanced Practice Providers (APPs -  Physician Assistants and Nurse Practitioners) who all work together to provide you with the care you need, when you need it.    Your next appointment:   4-5 month(s)  Provider:   Riley Lam, MD

## 2023-02-22 NOTE — Progress Notes (Signed)
Cardiology Office Note:    Date:  02/22/2023   ID:  Osvaldo Angst, DOB Aug 16, 1971, MRN [id number]  PCP:  Denise Soho, PA-C   [facility] Providers Cardiologist:  Denise Noe, MD (Inactive)     Referring MD: Denise Soho, PA-C   CC: Transition to new cardiologist Consulted for the evaluation of oHCM at the behest of Dr. Katrinka Valdez  History of Present Illness:    [name] is a 52 y.o. 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  Patient notes mild SOB at rest and  DOE with stairs and playing Pickleball. Is able to exercise on maximally titrated AV nodal therapy. Profound fatigue on metoprolol; no prior CCB trial, no prior Norpace trial. 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 Notes mild fatigue. Notes no palpitations Notes mild CP with exercise and stairs. Notes very rare dizziness. Notes no syncope.  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   atenolol (TENORMIN) 25 MG tablet Take 1 tablet (25 mg total) by mouth daily.   clonazePAM (KLONOPIN) 0.5 MG tablet Take 1 tablet (0.5 mg total) by mouth 2 (two) times daily as needed for anxiety.   ELIQUIS 5 MG TABS tablet TAKE 1 TABLET BY MOUTH TWICE A DAY   esomeprazole (NEXIUM) 20 MG capsule Take 20 mg by mouth daily at 12 noon.   losartan (COZAAR) 100 MG tablet Take 100 mg by mouth daily.   Omega-3 Fatty Acids (FISH OIL) 1000 MG CAPS Take by mouth.   PARoxetine (PAXIL) 30 MG tablet Take 2 tablets (60 mg total) by mouth daily.   rosuvastatin (CRESTOR) 20 MG tablet Take 20 mg by mouth daily.   Current Facility-Administered Medications for the 02/22/23 encounter (Office Visit) with Riley Lam A, MD  Medication   0.9 %  sodium chloride infusion   0.9 %  sodium chloride infusion     Allergies:   Ciprofloxacin   Social History   Socioeconomic History   Marital status: Married    Spouse name: Not on file   Number of children: 2   Years of education: Not on file   Highest education level: Not on file  Occupational History   Occupation: Runner, broadcasting/film/video   Tobacco Use   Smoking status: Never   Smokeless tobacco: Never   Tobacco comments:    Never smoke 12/07/22  Vaping Use   Vaping Use: Never used  Substance and Sexual Activity   Alcohol use: No   Drug use: No   Sexual activity: Not on file  Other Topics Concern   Not on file  Social History Narrative   Not on file   Social Determinants of Health   Financial Resource Strain: Not on file  Food Insecurity: Not on file  Transportation Needs: Not on file  Physical Activity: Not on file  Stress: Not on file  Social Connections: Not on file     Family History: The patient's family history includes Breast cancer (age of onset: 32) in her maternal aunt; Cancer (age  of onset: 3) in her sister; Colon cancer in an other family member; Colon polyps in her father; Diabetes Mellitus I in her maternal grandmother; Diverticulitis in her mother; GI Bleed in her mother; Heart attack in her mother and paternal grandfather; Heart disease in her maternal grandmother; Hyperlipidemia in her mother; Hypertension in her father, mother, and sister; Irritable bowel syndrome in her mother; Lung cancer (age of onset: 74) in her maternal aunt; Other in her sister. There is no history of Esophageal cancer, Stomach cancer, Rectal cancer, or Sudden Cardiac Death.  ROS:   Please see the history of present illness.     All other systems reviewed and are negative.  EKGs/Labs/Other Studies Reviewed:    The following studies were reviewed today:   EKG:  EKG is  ordered today.  The ekg ordered today demonstrates  02/22/2023: SR rate 71 LVH with narrow QRS  Cardiac Studies & Procedures     STRESS TESTS  EXERCISE TOLERANCE TEST (ETT) 11/19/2019  Narrative  Blood pressure demonstrated a normal response to exercise.  There was no ST segment deviation noted during stress.  The patient walked for a total of 10 minutes and 1 seconds of a standard Bruce protocol treadmill test. She achieved a peak heart rate of 164 which is 95% predicted maximal heart rate.  She had no ST or T wave changes to suggest ischemia at peak exercise.  Her blood pressure response to exercise was normal.  There was no QRS widening at peak exercise.  This is interpreted as a negative stress test. She has no evidence of inducible ischemia.   ECHOCARDIOGRAM  ECHOCARDIOGRAM COMPLETE 10/07/2019  Narrative ECHOCARDIOGRAM REPORT    Patient Name:   [name] Date of Exam: 10/07/2019 Medical Rec #:  [id number]              Height:       67.0 in Accession #:    [id number]             Weight:       214.0 lb Date of Birth:  1971/05/24               BSA:          2.08 m Patient Age:    48 years                BP:           124/76 mmHg Patient Gender: F                      HR:  74 bpm. Exam Location:  [address]  Procedure: 3D Echo, 2D Echo, Cardiac Doppler and Color Doppler  Indications:    R01.1 Murmur  History:        [name] Echocardiogram examinations. Signs/Symptoms:Chest Pain; Risk Factors:Family History of Coronary Artery Disease, Hypertension and Dyslipidemia.  Sonographer:    Farrel Conners RDCS Referring Phys: [phone] Barry Dienes [facility]  IMPRESSIONS   1. There is severe asymmetric hypertrophy of the basal septum. The LOVT gradient at rest by direct measurement is ~120 mmHG. Using the MR jet, a gradient of 115 mmg is estimated (assumed LA pressure of 15 mmHG). With valsalva the gradients do not appear to increase but the signal is contaminated by MR. There is systolic anterior motion of the mitral valve. Findings are consistent with hypertrophic obstructive cardiomyopathy. Appears to be sigmoid variant. Would consider cardiac MR for better characterization/quantification and LGE assessment. 2. Left ventricular ejection fraction, by visual estimation, is 60 to 65%. The left ventricle has normal function. There is severely increased left ventricular hypertrophy. 3. Left ventricular diastolic parameters are consistent with Grade I diastolic dysfunction (impaired relaxation). 4. The left ventricle has no regional wall motion abnormalities. 5. Global right ventricle has normal systolic function.The right ventricular size is normal. No increase in right ventricular wall thickness. 6. Left atrial size was normal. 7. Right atrial size was normal. 8. Presence of pericardial fat pad. 9. Trivial pericardial effusion is present. 10. The mitral valve is degenerative. Mild mitral valve regurgitation. 11. The tricuspid valve is grossly normal. Tricuspid valve regurgitation is trivial. 12. The aortic valve is tricuspid. Aortic valve regurgitation is not  visualized. No evidence of aortic valve sclerosis or stenosis. 13. The pulmonic valve was grossly normal. Pulmonic valve regurgitation is not visualized. 14. Mildly elevated pulmonary artery systolic pressure. 15. The tricuspid regurgitant velocity is 2.71 m/s, and with an assumed right atrial pressure of 3 mmHg, the estimated right ventricular systolic pressure is mildly elevated at 32.4 mmHg. 16. The inferior vena cava is normal in size with greater than 50% respiratory variability, suggesting right atrial pressure of 3 mmHg.  FINDINGS Left Ventricle: Left ventricular ejection fraction, by visual estimation, is 60 to 65%. The left ventricle has normal function. The left ventricle has no regional wall motion abnormalities. The left ventricular internal cavity size was the left ventricle is normal in size. There is severely increased left ventricular hypertrophy. Asymmetric left ventricular hypertrophy of the basal-septal wall. Left ventricular diastolic parameters are consistent with Grade I diastolic dysfunction (impaired relaxation). Normal left atrial pressure. There is severe asymmetric hypertrophy of the basal septum. The LOVT gradient at rest by direct measurement is ~120 mmHG. Using the MR jet, a gradient of 115 mmg is estimated (assumed LA pressure of 15 mmHG). With valsalva the gradients do not appear to increase but the signal is contaminated by MR. There is systolic anterior motion of the mitral valve. Findings are consistent with hypertrophic obstructive cardiomyopathy. Appears to be sigmoid variant. Would consider cardiac MR for better characterization/quantification and LGE assessment.  Right Ventricle: The right ventricular size is normal. No increase in right ventricular wall thickness. Global RV systolic function is has normal systolic function. The tricuspid regurgitant velocity is 2.71 m/s, and with an assumed right atrial pressure of 3 mmHg, the estimated right ventricular systolic  pressure is mildly elevated at 32.4 mmHg.  Left Atrium: Left atrial size was normal in size.  Right Atrium: Right atrial size was normal in size  Pericardium: Trivial pericardial effusion is present. Presence of pericardial fat pad.  Mitral Valve: The mitral valve is degenerative in appearance. Mild mitral valve regurgitation. MV peak gradient, 7.3 mmHg.  Tricuspid Valve: The tricuspid valve is grossly normal. Tricuspid valve regurgitation is trivial.  Aortic Valve: The aortic valve is tricuspid. Aortic valve regurgitation is not visualized. The aortic valve is structurally normal, with no evidence of sclerosis or stenosis. Aortic valve mean gradient measures 46.0 mmHg. Aortic valve peak gradient measures 88.8 mmHg. Aortic valve area, by VTI measures 1.09 cm.  Pulmonic Valve: The pulmonic valve was grossly normal. Pulmonic valve regurgitation is not visualized. Pulmonic regurgitation is not visualized.  Aorta: The aortic root and ascending aorta are structurally normal, with no evidence of dilitation.  Venous: The inferior vena cava is normal in size with greater than 50% respiratory variability, suggesting right atrial pressure of 3 mmHg.  IAS/Shunts: No atrial level shunt detected by color flow Doppler.   LEFT VENTRICLE PLAX 2D LVIDd:         2.94 cm  Diastology LVIDs:         2.08 cm  LV e' lateral:   7.94 cm/s LV PW:         1.20 cm  LV E/e' lateral: 14.4 LV IVS:        1.90 cm  LV e' medial:    5.22 cm/s LVOT diam:     2.30 cm  LV E/e' medial:  21.8 LV SV:         19 ml LV SV Index:   8.84 LVOT Area:     4.15 cm   RIGHT VENTRICLE RV S prime:     12.50 cm/s TAPSE (M-mode): 1.7 cm  LEFT ATRIUM             Index       RIGHT ATRIUM           Index LA diam:        3.80 cm 1.83 cm/m  RA Area:     21.40 cm LA Vol (A2C):   88.0 ml 42.28 ml/m RA Volume:   62.80 ml  30.17 ml/m LA Vol (A4C):   82.6 ml 39.69 ml/m LA Biplane Vol: 86.5 ml 41.56 ml/m AORTIC VALVE AV Area  (Vmax):    1.57 cm AV Area (Vmean):   1.80 cm AV Area (VTI):     1.09 cm AV Vmax:           471.25 cm/s AV Vmean:          306.500 cm/s AV VTI:            1.112 m AV Peak Grad:      88.8 mmHg AV Mean Grad:      46.0 mmHg LVOT Vmax:         178.33 cm/s LVOT Vmean:        133.000 cm/s LVOT VTI:          0.293 m LVOT/AV VTI ratio: 0.26  AORTA Ao Root diam: 3.40 cm Ao Asc diam:  3.50 cm  MITRAL VALVE                         TRICUSPID VALVE MV Area (PHT): 2.08 cm              TR Peak grad:   29.4 mmHg MV Peak grad:  7.3 mmHg  TR Vmax:        341.00 cm/s MV Mean grad:  2.0 mmHg MV Vmax:       1.35 m/s              SHUNTS MV Vmean:      61.9 cm/s             Systemic VTI:  0.29 m MV VTI:        0.40 m                Systemic Diam: 2.30 cm MV PHT:        106 msec MV Decel Time: 363 msec MR Peak grad: 211.4 mmHg MR Mean grad: 131.0 mmHg MR Vmax:      727.00 cm/s MR Vmean:     520.0 cm/s MV E velocity: 114.00 cm/s 103 cm/s MV A velocity: 83.60 cm/s  70.3 cm/s MV E/A ratio:  1.36        1.5   Lennie Odor MD Electronically signed by Lennie Odor MD Signature Date/Time: 10/07/2019/11:11:47 AM    Final    MONITORS  LONG TERM MONITOR (3-14 DAYS) 12/11/2019  Narrative  Normal sinus rhythm  Rare PACs  Rare nonsustained SVT less than 10 beats  No significant ventricular arrhythmia  No symptoms reported    Overall, benign study without high risk features in the setting of hypertrophic cardiomyopathy.    CARDIAC MRI  MR CARDIAC MORPHOLOGY W WO CONTRAST 11/06/2019  Narrative CLINICAL DATA:  Hypertrophic Cardiomyopathy  EXAM: CARDIAC MRI  TECHNIQUE: The patient was scanned on a 1.5 Tesla Siemens magnet. A dedicated cardiac coil was used. Functional imaging was done using Fiesta sequences. 2,3, and 4 chamber views were done to assess for RWMA's. Modified Simpson's rule using a short axis stack was used to calculate an ejection fraction on a  dedicated work [employer]. The patient received 12 cc of Gadavist after 10 minutes inversion recovery sequences were used to assess for infiltration and scar tissue.  CONTRAST:  Gadavist  FINDINGS: Mild LAE. Normal RA/RV size and function No ASD/PFO No pericardial effusion Normal aortic root 3.2 cm. Mild LVE. Normal quantitative EF 74% (EDV 115 cc ESV 30 cc SV 86 cc) Morphologically patient has hypertrophic cardiomyopathy. There is severe basal septal hypertrophy 21 mm compared to posterior wall 11 mm. There is an elongated anterior mitral leaflet with SAM. The LVOT gradient actually starts with abutment of a large hypertrophied and anteriorly displaced papillary muscle There is mild associated mitral regurgitation. The aortic valve itself was not well seen in series obtained but appears tri leaflet with some leaflet thickening and no stenosis in particular at valve level.  Advanced measurement of high risk features were negative. There was no delayed gadolinium uptake on inversion recovery sequences. The global T1 time (1055 msec) and the T1 time in septum (0454) are mildly increased. The T2 time in septum and posterior wall are normal 40-51 msec. The calculated ECV is in normal range as well 19% using a Hct of 39  IMPRESSION: 1. Mild LVE with severe asymmetric basal septal hypertrophy measuring 21 mm. Normal LVEF 74%  2. SAM of anterior mitral valve leaflet with LVOT gradient created by anterior mitral leaflet and large hypertrophied anteriorly displaced papillary muscle Suggest echo correlation  3.  Mild MR  4.  Mild LAE  5. No high risk features other than septal thickness with no delayed gadolinium uptake in septum, mildly prolonged T1 times and normal T2  and ECV  Charlton Haws   Electronically Signed By: Charlton Haws M.D. On: 11/06/2019 14:48          Recent Labs: No results found for requested labs within last 365 days.  Recent Lipid  Panel    Component Value Date/Time   CHOL 201 (H) 10/07/2019 0903   TRIG 76 10/07/2019 0903   HDL 77 10/07/2019 0903   CHOLHDL 2.6 10/07/2019 0903   LDLCALC 110 (H) 10/07/2019 0903    Physical Exam:    VS:  BP 125/77   Pulse 71   Ht 5\' 7"  (1.702 m)   Wt 248 lb (112.5 kg)   SpO2 96%   BMI 38.84 kg/m     Wt Readings from Last 3 Encounters:  02/22/23 248 lb (112.5 kg)  12/07/22 244 lb 9.6 oz (110.9 kg)  12/06/21 242 lb 3.2 oz (109.9 kg)    GEN:  Well nourished, well developed, morbid obesity HEENT: Normal NECK: No JVD CARDIAC: RRR, no rubs, gallops, resting harsh systolic murmur worse with Valsalva RESPIRATORY:  Clear to auscultation without rales, wheezing or rhonchi  ABDOMEN: Soft, non-tender, non-distended MUSCULOSKELETAL:  No edema; No deformity  SKIN: Warm and dry NEUROLOGIC:  Alert and oriented x 3 PSYCHIATRIC:  Normal affect   ASSESSMENT:    1. Hypertrophic obstructive cardiomyopathy   2. Hyperlipidemia, unspecified hyperlipidemia type   3. Paroxysmal atrial fibrillation (HCC)   4. Hypercoagulable state due to paroxysmal atrial fibrillation (HCC)    PLAN:    Hypertrophic Cardiomyopathy - with HTN - Sigmoid septal variant Variant - peak gradient 2020 mm Hg with induction (08/25/2022) - SAM associated MR not characterized at [facility] - suspicion of Fabry's/Danon or other mimics of HCM: low - Gene variant: Negative testing at [facility]  - NYHA II - Non HCM Contributors to disease/status (minimal, weight gain potentially) - on Losartan for BP control with INHERIT trial data  Family history , Discussed family screening  (Gene negative, Daugther yearly echos, son echo every 5 years) No hx of SCD in family - Family history and HTN: Discussed potential role for Valsartan in daugther  SCD  Assessment - Exercise testing normal for normal rhythm  - CMR from 2021 notable for no LGE -  2 year assessment for VT on rhythm monitor with no NSVT - SCD risk is low Repeat CMR  in 2025  Atrial fibrillation (PAF, rare, CHADVASC NA) - Atrial arrhythmia management: continue atenolol 25 mg PO daily; continue eliquis  Medication symptom plan - I do not think we can de-escalated vasodilator therapy to improve sx - euvolemic - We discussed disopyramide; I think she has better options with less potential symptoms - we have discussed start of mavacamten, clinical trials for aficamten or imbria, ASA and septal myectomy, we have discussed disease state at length - 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  > 50% of time was spent in direct patient care.      Medication Adjustments/Labs and Tests Ordered: Current  medicines are reviewed at length with the patient today.  Concerns regarding medicines are outlined above.  Orders Placed This Encounter  Procedures   CT CARDIAC SCORING (SELF PAY ONLY)   Lipid panel   EKG 12-Lead   No orders of the defined types were placed in this encounter.   Patient Instructions  Medication Instructions:  Your physician recommends that you continue on your current medications as directed. Please refer to the Current Medication list given to you today.  *If you need a refill on your cardiac medications before your next appointment, please call your pharmacy*   Lab Work: Fasting lipid panel (nothing to eat or drink 8-12 hours before except water and black coffee)  If you have labs (blood work) drawn today and your tests are completely normal, you will  receive your results only by: MyChart Message (if you have MyChart) OR A paper copy in the mail If you have any lab test that is abnormal or we need to change your treatment, we will call you to review the results.   Testing/Procedures: Your physician has ordered you to have non-invasive CT for Calcium Scoring of your heart. It will calculate your risk of developing Coronary Artery Disease (CAD) by measuring the amount of buildup of calcium in the plaque in the coronary arteries (arteries surrounding your heart).     Follow-Up: At [facility], you and your health needs are our priority.  As part of our continuing mission to provide you with exceptional heart care, we have created designated Provider Care Teams.  These Care Teams include your primary Cardiologist (physician) and Advanced Practice Providers (APPs -  Physician Assistants and Nurse Practitioners) who all work together to provide you with the care you need, when you need it.    Your next appointment:   4-5 month(s)  Provider:   Riley Lam, MD      Signed, Denise Constant, MD  02/22/2023 6:21 PM    [facility]

## 2023-02-27 ENCOUNTER — Encounter: Payer: Self-pay | Admitting: Physician Assistant

## 2023-03-05 ENCOUNTER — Ambulatory Visit: Payer: BC Managed Care – PPO | Attending: Internal Medicine

## 2023-03-05 DIAGNOSIS — E785 Hyperlipidemia, unspecified: Secondary | ICD-10-CM

## 2023-03-06 LAB — LIPID PANEL
Chol/HDL Ratio: 2.5 ratio (ref 0.0–4.4)
Cholesterol, Total: 179 mg/dL (ref 100–199)
HDL: 72 mg/dL (ref >39)
LDL Chol Calc (NIH): 93 mg/dL (ref 0–99)
Triglycerides: 73 mg/dL (ref 0–149)
VLDL Cholesterol Cal: 14 mg/dL (ref 5–40)

## 2023-03-16 ENCOUNTER — Ambulatory Visit (HOSPITAL_COMMUNITY)
Admission: RE | Admit: 2023-03-16 | Discharge: 2023-03-16 | Disposition: A | Discharge status: Home / Self Care | Payer: BC Managed Care – PPO | Source: Ambulatory Visit | Attending: Internal Medicine | Admitting: Internal Medicine

## 2023-03-16 DIAGNOSIS — E785 Hyperlipidemia, unspecified: Secondary | ICD-10-CM | POA: Insufficient documentation

## 2023-03-21 ENCOUNTER — Encounter: Payer: Self-pay | Admitting: Internal Medicine

## 2023-03-22 ENCOUNTER — Other Ambulatory Visit: Payer: Self-pay | Admitting: Obstetrics and Gynecology

## 2023-03-22 DIAGNOSIS — Z1231 Encounter for screening mammogram for malignant neoplasm of breast: Secondary | ICD-10-CM

## 2023-03-22 NOTE — Telephone Encounter (Signed)
The patient has been notified of the result and verbalized understanding.  All questions (if any) were answered. Denise Right Geronimo Diliberto, RN 03/22/2023 11:08 AM  Pt reports had OV with HCM cardiologist on 03/21/23.  Will be starting Camzyos with Dr. Regino Schultze.   Pt reports is already taking rosuvastatin 40 mg.  Per pt Dr. Regino Schultze expressed that labs look okay 40 mg is the highest dose and doesn't think she needs higher dose.   I have updated medication list and will send message to MD as an FYI.

## 2023-03-22 NOTE — Telephone Encounter (Signed)
-----   Message from Christell Constant, MD sent at 03/21/2023  1:00 PM EDT ----- Results: No coronary artery calcification Severe mitral annular calcification Plan: Increase rosuvastatin to 40 mg Lipids and ALT in 3 months  Christell Constant, MD

## 2023-04-06 ENCOUNTER — Ambulatory Visit: Payer: BC Managed Care – PPO | Admitting: Physician Assistant

## 2023-05-02 ENCOUNTER — Other Ambulatory Visit (HOSPITAL_COMMUNITY): Payer: Self-pay | Admitting: Physician Assistant

## 2023-05-02 NOTE — Telephone Encounter (Signed)
Prescription refill request for Eliquis received. Indication: PAF Last office visit: 02/22/23  Merlyn Lot MD Scr:0.95 on 06/27/21  KPN Age: 52 Weight: 112.5kg  Based on above findings Eliquis 5mg  twice daily is the appropriate dose.  Refill approved.  Requested labs be done at upcoming appt with Dr Izora Ribas.

## 2023-05-07 ENCOUNTER — Ambulatory Visit: Admission: RE | Admit: 2023-05-07 | Payer: BC Managed Care – PPO | Source: Ambulatory Visit

## 2023-05-07 DIAGNOSIS — Z1231 Encounter for screening mammogram for malignant neoplasm of breast: Secondary | ICD-10-CM

## 2023-06-04 ENCOUNTER — Ambulatory Visit: Payer: BC Managed Care – PPO | Admitting: Psychiatry

## 2023-06-04 ENCOUNTER — Encounter: Payer: Self-pay | Admitting: Psychiatry

## 2023-06-04 DIAGNOSIS — F410 Panic disorder [episodic paroxysmal anxiety] without agoraphobia: Secondary | ICD-10-CM | POA: Diagnosis not present

## 2023-06-04 DIAGNOSIS — F411 Generalized anxiety disorder: Secondary | ICD-10-CM | POA: Diagnosis not present

## 2023-06-04 MED ORDER — PAROXETINE HCL 30 MG PO TABS: 60.0000 mg | ORAL_TABLET | Freq: Every day | ORAL | 3 refills | Status: DC

## 2023-06-04 NOTE — Progress Notes (Signed)
[name] [id number] [birth year] 52 y.o.  Subjective:   Patient ID:  [name] is a 52 y.o. (DOB 06/15/1971) female.  Chief Complaint:  Chief Complaint  Patient presents with   Follow-up   Anxiety    Anxiety Symptoms include palpitations. Patient reports no chest pain, dizziness or shortness of breath.     Daniel Rashanna Ramsaroop presents to the office today for follow-up of generalized anxiety disorder and panic disorder without agoraphobia.  Anxiety worse since January 2020.  She was first seen on May 18, 2020.  At that time she was taking sertraline 150 mg daily for 2 months without much change.  She was also taking a low-dose of clonazepam.   She agreed to cross taper to paroxetine 40 mg and to give it at least 8 weeks at that dose to get improved response.  Typically paroxetine is more effective for panic than is Lexapro she had taken in the past with buspirone.  Also just just suggested increasing buspirone to 30 mg twice daily to maximize response.  seen January 15, 2020.  Following was noted: NR anxiety with paroxetine 40 for 6-7 weeks plus buspirone 30 BID.  Tolerated. Increase paroxetine 60 mg daily. Schedule Klonopin 0.25 mg BID bc morning anxiety bc awakening with anxiety.  February 19, 1999 2100 appointment, the following is noted: Increased paroxetine 60 mg daily and Klonopin 0.5 mg daily since April 10. OK, but not as well as I want to.  Still needs the clonazepam.    Maybe some benefit from the increase paroxetine.   Crying spells for no reason lately.  Frustrated.  Still gets rushes of anxity with numbness tingling, butterflies in the stomach.  Eating is fine. But still anxiety is worse in the morning. Plan was to start buspirone due to limited response.  03/25/2020 appointment with the following noted: Better with less anxiety and now not having to take Klonopin daily.  Last taken it Saturday. No recent panic and anxiety is generally managed.    Tolerating paroxetine 60 mg daily.  Plan: worked well so no change  07/07/20 appt with the following noted: Pretty good usually.  Overall OK but anxious today without reason and took extra clonazepam.   Not markedly depressed.  Worries over taking clonazepam. Felt she should get more out of 60 mg paroxetine.  More anxious on weekends fiirst thing. No SE paroxetine. Average clonopin is reduced to just every 3 weeks. Lately not enough sleep.   A little more anxious about heart condition. Plan: No med changes  11/03/2020 appointment with following noted: Still on paroxetine 60.  Clonazepam a little more often than she'd like 3 times weekly at 1/2 tablet over the last 3 weeks.  Sunday woke up with anxiety and needed more.  Tolerates it well.  No panic.  With anxiety stomach gets queasy and tingling in body and generally nervous and sometimes emotional with it out of frustration. Drives me crazy that did so well for so long on just paroxetine 20.  Doesn't feel normal entirely in the last 3 years after going to coworkers funeral that triggered this. Plan: No med changes  03/03/2021 appointment with the following noted: Tough 4 mos mainly related to F's unexpected death. Anxiety wise got through it OK.  That week daily clonazepam.  A little scare for herself with afib end of March corrected. Ongoing cardiac issues.  No excess clonazepam. Parents live 10 min away and helping mother more. Occ flareups of anxiety that  pass.   Plan: continue paroxetine 60 and clonazepam 0.5 mg BID prn  09/01/2021 appointment with the following noted: Infrequent clonazepam 0.25 mg prn. Anxiety fluctuates with more in AUG DT stressors and better since then. Sleep is good.   SE none. No afib since here but still palpitations.   Empty nesters.   Plan no med changes  06/01/22  appt noted:  Continues paroxetine 60 and clonazepam 0.5 mg BID prn.  Not often and when used is 0.25 mg prn. Peach clonazepam does not work  as well ([facility]).   Yellow is better. Changing grade levels at school.  Didn't want to do it.   No panic in a year.  Anxiety usually under control.   06/04/23 appt noted: Continues paroxetine 60 and clonazepam 0.5 mg BID prn rarely used. Doing well overall.  With heart condition gets a little dizzy and clonazepam 0.25 mg prn helped.  Started new heart med, Camzyos.   Some anxiety here and there but no full panic.  Clonazepam helps when needed.  Patient reports stable mood and denies depressed or irritable moods.    Patient denies difficulty with sleep initiation or maintenance. Denies appetite disturbance.  Patient reports that energy and motivation have been good.  Patient denies any difficulty with concentration.  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 Abt to start school.  Her D is about to move and start Jr at [employer].    Son grad school [address].   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  Past Psychiatric Medication Trials: Sertraline 150 no response but inadequate duration Lexapro 30 with buspirone 20 twice daily with some response Paroxetine 60 (took 20 mg for years without relapse) NR anxiety with paroxetine 40 for 6-7 weeks plus buspirone 30 BID. Buspirone 30 BID Low-dose clonazepam (Accord less effective than yellow one)  Review of Systems:  Review of Systems  Respiratory:  Negative for chest tightness and shortness of breath.   Cardiovascular:  Positive for palpitations. Negative for chest pain.  Neurological:  Negative for dizziness, tremors and weakness.  Psychiatric/Behavioral:  Negative for dysphoric mood.    DX HYPERTROPHIC OBSTRUCTIVE CARDIOMYOPATHY  Medications: I have reviewed the patient's current medications.  Current Outpatient Medications  Medication Sig Dispense Refill   apixaban (ELIQUIS) 5 MG TABS tablet TAKE 1 TABLET BY MOUTH TWICE A DAY 60 tablet 5   Ascorbic Acid (VITAMIN C) 1000 MG tablet Take 1,000 mg by mouth daily.     atenolol (TENORMIN) 25 MG tablet Take 1 tablet  (25 mg total) by mouth daily. 90 tablet 3   CAMZYOS 5 MG CAPS capsule Take 5 mg by mouth daily.     clonazePAM (KLONOPIN) 0.5 MG tablet Take 1 tablet (0.5 mg total) by mouth 2 (two) times daily as needed for anxiety. 60 tablet 5   esomeprazole (NEXIUM) 20 MG capsule Take 20 mg by mouth daily at 12 noon.     losartan (COZAAR) 100 MG tablet Take 100 mg by mouth daily.     Omega-3 Fatty Acids (FISH OIL) 1000 MG CAPS Take by mouth.     rosuvastatin (CRESTOR) 40 MG tablet Take 40 mg by mouth daily.     PARoxetine (PAXIL) 30 MG tablet Take 2 tablets (60 mg total) by mouth daily. 180 tablet 3   Current Facility-Administered Medications  Medication Dose Route Frequency Provider Last Rate Last Admin   0.9 %  sodium chloride infusion  500 mL Intravenous Continuous Nandigam, Eleonore Chiquito, MD       0.9 %  sodium chloride infusion  500 mL Intravenous Once Nandigam, Eleonore Chiquito, MD        Medication Side Effects: None  Allergies:  Allergies  Allergen Reactions   Ciprofloxacin Rash    Past Medical History:  Diagnosis Date   Anxiety    Family history of breast cancer    GERD (gastroesophageal reflux disease)    Heart murmur    History of colonic polyps 05/27/2018   Hyperlipidemia    Hypertension    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 2017   Diverticulitis Mother        Aug. 2017   Irritable bowel syndrome Mother    Hypertension Father    Colon polyps Father    Hypertension Sister    Heart disease Maternal Grandmother     Diabetes Mellitus I Maternal Grandmother    Heart attack Paternal Grandfather    Colon cancer Other        mat great uncle, dx in 78's   Breast cancer Maternal Aunt 45   Lung cancer Maternal Aunt 59   Cancer Sister 3       eye tumor/cancer- unk the name   Other Sister        Brain tumors   Esophageal cancer Neg Hx    Stomach cancer Neg Hx    Rectal cancer Neg Hx    Sudden Cardiac Death Neg Hx     Social History   Socioeconomic History   Marital status: Married    Spouse name: Not on file   Number of children: 2   Years of education: Not on file   Highest education level: Not on file  Occupational History   Occupation: Runner, broadcasting/film/video   Tobacco Use   Smoking status: Never   Smokeless tobacco: Never   Tobacco comments:    Never smoke 12/07/22  Vaping Use   Vaping status: Never Used  Substance and Sexual Activity   Alcohol use: No   Drug use: No   Sexual activity: Not on file  Other Topics Concern   Not on file  Social History Narrative   Not on file   Social Determinants of Health   Financial Resource Strain: Not on file  Food Insecurity: Not on file  Transportation Needs: Not on file  Physical Activity: Not on file  Stress: Not on file  Social Connections: Not on file  Intimate Partner Violence: Not on file    Past Medical History, Surgical history, Social history, and Family history were reviewed and updated as appropriate.   Please see review of systems for further details on the patient's review from today.   Objective:   Physical Exam:  There were no vitals taken for this visit.  Physical Exam Constitutional:      General: She is not in acute distress. Musculoskeletal:        General: No deformity.  Neurological:     Mental Status: She  is alert and oriented to person, place, and time.     Coordination: Coordination normal.  Psychiatric:        Attention and Perception: Attention and perception normal. She does not perceive auditory or visual  hallucinations.        Mood and Affect: Mood is anxious. Mood is not depressed. Affect is not labile, blunt or tearful.        Speech: Speech normal.        Behavior: Behavior normal.        Thought Content: Thought content normal. Thought content is not paranoid or delusional. Thought content does not include homicidal or suicidal ideation. Thought content does not include suicidal plan.        Cognition and Memory: Cognition and memory normal.        Judgment: Judgment normal.     Comments: Insight intact      Lab Review:     Component Value Date/Time   NA 137 03/02/2021 1541   NA 139 10/07/2019 0903   K 3.9 03/02/2021 1541   CL 105 03/02/2021 1541   CO2 27 03/02/2021 1541   GLUCOSE 89 03/02/2021 1541   BUN 16 03/02/2021 1541   BUN 13 10/07/2019 0903   CREATININE 0.90 03/02/2021 1541   CALCIUM 9.2 03/02/2021 1541   PROT 6.3 10/07/2019 0903   ALBUMIN 4.1 10/07/2019 0903   AST 17 10/07/2019 0903   ALT 10 10/07/2019 0903   ALKPHOS 42 10/07/2019 0903   BILITOT 0.8 10/07/2019 0903   GFRNONAA >60 03/02/2021 1541   GFRAA 85 10/07/2019 0903       Component Value Date/Time   WBC 5.6 03/02/2021 1541   RBC 4.27 03/02/2021 1541   HGB 12.5 03/02/2021 1541   HCT 37.4 03/02/2021 1541   PLT 174 03/02/2021 1541   MCV 87.6 03/02/2021 1541   MCH 29.3 03/02/2021 1541   MCHC 33.4 03/02/2021 1541   RDW 12.9 03/02/2021 1541    No results found for: "POCLITH", "LITHIUM"   No results found for: "PHENYTOIN", "PHENOBARB", "VALPROATE", "CBMZ"   .res Assessment: Plan:    Renasia was seen today for follow-up and anxiety.  Diagnoses and all orders for this visit:  Generalized anxiety disorder -     PARoxetine (PAXIL) 30 MG tablet; Take 2 tablets (60 mg total) by mouth daily.  Panic disorder without agoraphobia -     PARoxetine (PAXIL) 30 MG tablet; Take 2 tablets (60 mg total) by mouth daily.   DX HYPERTROPHIC OBSTRUCTIVE CARDIOMYOPATHY   NR anxiety with paroxetine 40 for 6-7  weeks plus buspirone 30 BID.  Tolerated. However paroxetine 60 mg daily worked pretty well after 6-8 weeks.. Disc SE  Disc SE in detail and SSRI withdrawal sx. She has dizziness as part of the heart condition. Prn clonazepam helps at 0.25 mg.  Continue clonazepam 0.5 mg  prn ([facility] works but not Accord)  She has been able to reduce the clonazepam from scheduled to as needed and has not needed to take it nearly as often.  No med changes are indicated.  But she gets frustrated at having to use it bc feels like a failure. Disc differences in generics noticeable at low doses.  She doesn't like Accord as much.  ([facility] works but not Accord)  Option abilify potentiation if needed.  We discussed the short-term risks associated with benzodiazepines including sedation and increased fall risk among others.  Discussed long-term side effect risk including dependence, potential withdrawal symptoms, and the  potential eventual dose-related risk of dementia.  But recent studies from 2020 dispute this association between benzodiazepines and dementia risk. Newer studies in 2020 do not support an association with dementia.  FU 12 mos bc stable.  Meredith Staggers, MD, DFAPA '  Please see After Visit Summary for patient specific instructions.  Future Appointments  Date Time Provider Department Center  06/11/2023  3:00 PM Meredith Pel, NP [facility]  08/08/2023  3:40 PM Christell Constant, MD [facility]     No orders of the defined types were placed in this encounter.   -------------------------------

## 2023-06-11 ENCOUNTER — Ambulatory Visit: Payer: BC Managed Care – PPO | Admitting: Nurse Practitioner

## 2023-08-06 ENCOUNTER — Ambulatory Visit: Payer: BC Managed Care – PPO | Admitting: Internal Medicine

## 2023-08-07 ENCOUNTER — Encounter: Payer: Self-pay | Admitting: Internal Medicine

## 2023-08-08 ENCOUNTER — Ambulatory Visit: Payer: BC Managed Care – PPO | Admitting: Internal Medicine

## 2023-08-22 ENCOUNTER — Other Ambulatory Visit (INDEPENDENT_AMBULATORY_CARE_PROVIDER_SITE_OTHER): Payer: BC Managed Care – PPO

## 2023-08-22 ENCOUNTER — Ambulatory Visit (INDEPENDENT_AMBULATORY_CARE_PROVIDER_SITE_OTHER): Payer: BC Managed Care – PPO | Admitting: Nurse Practitioner

## 2023-08-22 ENCOUNTER — Other Ambulatory Visit: Payer: Self-pay

## 2023-08-22 ENCOUNTER — Encounter: Payer: Self-pay | Admitting: Nurse Practitioner

## 2023-08-22 ENCOUNTER — Telehealth: Payer: Self-pay

## 2023-08-22 ENCOUNTER — Encounter (HOSPITAL_BASED_OUTPATIENT_CLINIC_OR_DEPARTMENT_OTHER): Payer: Self-pay | Admitting: Emergency Medicine

## 2023-08-22 ENCOUNTER — Emergency Department (HOSPITAL_BASED_OUTPATIENT_CLINIC_OR_DEPARTMENT_OTHER): Payer: BC Managed Care – PPO | Admitting: Radiology

## 2023-08-22 ENCOUNTER — Emergency Department (HOSPITAL_BASED_OUTPATIENT_CLINIC_OR_DEPARTMENT_OTHER): Payer: BC Managed Care – PPO

## 2023-08-22 VITALS — BP 110/80 | HR 73 | Ht 67.0 in | Wt 254.8 lb

## 2023-08-22 DIAGNOSIS — S99912A Unspecified injury of left ankle, initial encounter: Secondary | ICD-10-CM | POA: Diagnosis present

## 2023-08-22 DIAGNOSIS — I4891 Unspecified atrial fibrillation: Secondary | ICD-10-CM | POA: Insufficient documentation

## 2023-08-22 DIAGNOSIS — M25531 Pain in right wrist: Secondary | ICD-10-CM | POA: Diagnosis not present

## 2023-08-22 DIAGNOSIS — I422 Other hypertrophic cardiomyopathy: Secondary | ICD-10-CM | POA: Diagnosis not present

## 2023-08-22 DIAGNOSIS — S0990XA Unspecified injury of head, initial encounter: Secondary | ICD-10-CM | POA: Insufficient documentation

## 2023-08-22 DIAGNOSIS — W108XXA Fall (on) (from) other stairs and steps, initial encounter: Secondary | ICD-10-CM | POA: Diagnosis not present

## 2023-08-22 DIAGNOSIS — S8262XA Displaced fracture of lateral malleolus of left fibula, initial encounter for closed fracture: Secondary | ICD-10-CM | POA: Diagnosis not present

## 2023-08-22 DIAGNOSIS — Z7901 Long term (current) use of anticoagulants: Secondary | ICD-10-CM | POA: Diagnosis not present

## 2023-08-22 DIAGNOSIS — Z860100 Personal history of colon polyps, unspecified: Secondary | ICD-10-CM | POA: Diagnosis not present

## 2023-08-22 DIAGNOSIS — I421 Obstructive hypertrophic cardiomyopathy: Secondary | ICD-10-CM

## 2023-08-22 LAB — COMPREHENSIVE METABOLIC PANEL
ALT: 13 U/L (ref 0–35)
AST: 17 U/L (ref 0–37)
Albumin: 4.3 g/dL (ref 3.5–5.2)
Alkaline Phosphatase: 63 U/L (ref 39–117)
BUN: 23 mg/dL (ref 6–23)
CO2: 30 meq/L (ref 19–32)
Calcium: 9.9 mg/dL (ref 8.4–10.5)
Chloride: 103 meq/L (ref 96–112)
Creatinine, Ser: 1.15 mg/dL (ref 0.40–1.20)
GFR: 54.71 mL/min — ABNORMAL LOW (ref >60.00)
Glucose, Bld: 95 mg/dL (ref 70–99)
Potassium: 4.1 meq/L (ref 3.5–5.1)
Sodium: 140 meq/L (ref 135–145)
Total Bilirubin: 0.6 mg/dL (ref 0.2–1.2)
Total Protein: 7.3 g/dL (ref 6.0–8.3)

## 2023-08-22 LAB — CBC WITH DIFFERENTIAL/PLATELET
Basophils Absolute: 0.1 10*3/uL (ref 0.0–0.1)
Basophils Relative: 0.9 % (ref 0.0–3.0)
Eosinophils Absolute: 0 10*3/uL (ref 0.0–0.7)
Eosinophils Relative: 0.1 % (ref 0.0–5.0)
HCT: 40 % (ref 36.0–46.0)
Hemoglobin: 12.9 g/dL (ref 12.0–15.0)
Lymphocytes Relative: 20.9 % (ref 12.0–46.0)
Lymphs Abs: 1.6 10*3/uL (ref 0.7–4.0)
MCHC: 32.2 g/dL (ref 30.0–36.0)
MCV: 85.7 fL (ref 78.0–100.0)
Monocytes Absolute: 0.5 10*3/uL (ref 0.1–1.0)
Monocytes Relative: 7.2 % (ref 3.0–12.0)
Neutro Abs: 5.4 10*3/uL (ref 1.4–7.7)
Neutrophils Relative %: 70.9 % (ref 43.0–77.0)
Platelets: 209 10*3/uL (ref 150.0–400.0)
RBC: 4.66 Mil/uL (ref 3.87–5.11)
RDW: 14.4 % (ref 11.5–15.5)
WBC: 7.6 10*3/uL (ref 4.0–10.5)

## 2023-08-22 MED ORDER — NA SULFATE-K SULFATE-MG SULF 17.5-3.13-1.6 GM/177ML PO SOLN: 1.0000 | Freq: Once | ORAL | 0 refills | Status: AC

## 2023-08-22 NOTE — ED Triage Notes (Signed)
Pt c/o left ankle and right wrist pain after falling down 6 steps tonight. Pt also hit her head, takes eliquis. Denies LOC or headache.

## 2023-08-22 NOTE — Telephone Encounter (Signed)
Error

## 2023-08-22 NOTE — Patient Instructions (Addendum)
You have been scheduled for a colonoscopy. Please follow written instructions given to you at your visit today.  Please pick up your prep supplies at the pharmacy within the next 1-3 days. If you use inhalers (even only as needed), please bring them with you on the day of your procedure.   Your provider has requested that you go to the basement level for lab work before leaving today. Press "B" on the elevator. The lab is located at the first door on the left as you exit the elevator.   Due to recent changes in healthcare laws, you may see the results of your imaging and laboratory studies on MyChart before your provider has had a chance to review them.  We understand that in some cases there may be results that are confusing or concerning to you. Not all laboratory results come back in the same time frame and the provider may be waiting for multiple results in order to interpret others.  Please give us 48 hours in order for your provider to thoroughly review all the results before contacting the office for clarification of your results.    Thank you for trusting me with your gastrointestinal care!   Colleen Kennedy-Smith, CRNP   

## 2023-08-22 NOTE — Progress Notes (Signed)
08/22/2023 [name] [id number] 10/31/1970   CHIEF COMPLAINT: Schedule a colonoscopy   HISTORY OF PRESENT ILLNESS: [name] is a 52 year old female with a past medical history significant for obstructive sleep apnea, hypertension, paroxysmal atrial fibrillation, hypertrophic cardiomyopathy, GERD and colon polyps. She is known by Dr. Lavon Paganini. She presents today to schedule a colonoscopy.  She underwent a colonoscopy 03/15/2017 which showed two hyperplastic polyps removed from the descending colon, one large tubular adenomatous polyp removed from the sigmoid and a large 35mm tubular adenomatous polyps with focal high grade dysplasia was removed from the sigmoid colon, polypectomy site was tattooed. A follow up colonoscopy 03/22/2018 showed one benign lymphoid polyp removed from the sigmoid and two hyperplastic polyps removed from the sigmoid and rectum. Father with history of colon polyps.   She denies having any abdominal pain. She is passing a normal brown formed stool daily. No rectal bleeding or black stools. No GERD symptoms, previously took Nexium which was switched to Pantoprazole after she was started on Camzyos.   She has hypertrophic cardiomyopathy on Camzyos since 03/2023 followed by cardiologist Dr. Greig Castilla Want at [facility] and Dr. Izora Ribas in [place]. She has no risk factors for sudden death related to hypertrophic cardiomyopathy so no indication for primary prevention ICD. ECHO 08/15/2023 showed LV EF 55 - 60%. She denies having any CP or SOB. She is scheduled for a follow up ECHO (once monthly since starting Camzyos) and follow up appointment with Dr. Regino Schultze 11//2024.   PAST GI PROCEDURES:  Colonoscopy 03/22/2018:  - One 5 mm polyp in the sigmoid colon, removed with a cold snare. Resected and retrieved.  - One 1 mm polyp in the sigmoid colon, removed with a cold biopsy forceps. Resected and retrieved.  - One 5 mm polyp at the recto-sigmoid colon, removed with  a cold snare. Resected and retrieved.  - A tattoo was seen in the rectum. A post-polypectomy scar was found - 5 Year recll 1. Surgical [P], sigmoid, polyp - BENIGN LYMPHOID POLYP (ONE). - NO ADENOMATOUS CHANGE OR MALIGNANCY. 2. Surgical [P], rectum and sigmoid, polyp (2) - HYPERPLASTIC POLYP (TWO). - NO ADENOMATOUS CHANGE OR MALIGNANCY  Colonoscopy 03/15/2017:  - Two 4 to 6 mm polyps in the descending colon, removed with a cold snare. Resected and retrieved.  - Two large polyps in the sigmoid colon and in the descending colon, removed with a hot snare. Resected and retrieved. Clips (MR conditional) were placed. Tattooed.  - Internal hemorrhoids. 1. Colon, polyp(s), descending, polyp (2) - HYPERPLASTIC POLYP (X2 FRAGMENTS). - NO DYSPLASIA OR MALIGNANCY. 2. Colon, polyp(s), descending 35cm - TUBULAR ADENOMA WITH FOCAL HIGH GRADE DYSPLASIA. - INKED RESECTION MARGIN IS NEGATIVE FOR DYSPLASIA. 3. Colon, polyp(s), sigmoid - TUBULAR ADENOMA. - NO HIGH GRADE DYSPLASIA OR MALIGNANCY.     Latest Ref Rng & Units 03/02/2021    3:41 PM 07/12/2018    2:17 PM 10/31/2017    7:27 PM  CBC  WBC 4.0 - 10.5 K/uL 5.6  7.4  6.2   Hemoglobin 12.0 - 15.0 g/dL 40.9  81.1  91.4   Hematocrit 36.0 - 46.0 % 37.4  39.6  40.0   Platelets 150 - 400 K/uL 174  184  188        Latest Ref Rng & Units 03/02/2021    3:41 PM 10/07/2019    9:03 AM 07/12/2018    2:17 PM  CMP  Glucose 70 - 99 mg/dL 89  782  956  BUN 6 - 20 mg/dL 16  13  18    Creatinine 0.44 - 1.00 mg/dL 2.35  5.73  2.20   Sodium 135 - 145 mmol/L 137  139  137   Potassium 3.5 - 5.1 mmol/L 3.9  3.9  3.1   Chloride 98 - 111 mmol/L 105  100  100   CO2 22 - 32 mmol/L 27  27  25    Calcium 8.9 - 10.3 mg/dL 9.2  9.6  25.4   Total Protein 6.0 - 8.5 g/dL  6.3    Total Bilirubin 0.0 - 1.2 mg/dL  0.8    Alkaline Phos 39 - 117 IU/L  42    AST 0 - 40 IU/L  17    ALT 0 - 32 IU/L  10        Past Medical History:  Diagnosis Date   Anxiety    Family history  of breast cancer    GERD (gastroesophageal reflux disease)    Heart murmur    History of colonic polyps 05/27/2018   Hyperlipidemia    Hypertension    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 She has 2 sons and 2 daughters. She is a [employer] officer. Nonsmoker. No alcohol use. No drug use.   Family History: family history includes Breast cancer (age of onset: 27) in her maternal aunt; Cancer (age of onset: 3) in her sister; Colon cancer in an other family member; Colon polyps in her father; Diabetes Mellitus I in her maternal grandmother; Diverticulitis in her mother; GI Bleed in her mother; Heart attack in her mother and paternal grandfather; Heart disease in her maternal grandmother; Hyperlipidemia in her mother; Hypertension in her father, mother, and sister; Irritable bowel syndrome in her mother; Lung cancer (age of onset: 98) in her maternal aunt; Other in her sister. Maternal great uncle had colon cancer.   Allergies  Allergen Reactions   Ciprofloxacin Rash      Outpatient Encounter Medications as of 08/22/2023  Medication Sig   apixaban (ELIQUIS) 5 MG TABS tablet TAKE 1 TABLET BY MOUTH TWICE A DAY   atenolol (TENORMIN) 25 MG tablet Take 1 tablet (25 mg total) by mouth daily.   CAMZYOS 5 MG CAPS capsule Take 5 mg by mouth daily.   clonazePAM (KLONOPIN) 0.5 MG tablet Take 1 tablet (0.5 mg total) by mouth 2 (two) times daily as needed for anxiety.   losartan (COZAAR) 100 MG tablet Take 100 mg by mouth daily.   Omega-3 Fatty  Acids (FISH OIL) 1000 MG CAPS Take by mouth.   pantoprazole (PROTONIX) 20 MG tablet Take 20 mg by mouth daily.   PARoxetine (PAXIL) 30 MG tablet Take 2 tablets (60 mg total) by mouth daily.   rosuvastatin (CRESTOR) 40 MG tablet Take 40 mg by mouth daily.   [DISCONTINUED] Ascorbic Acid (VITAMIN C) 1000 MG tablet Take 1,000 mg by mouth daily. (Patient not taking: Reported on 08/22/2023)   [DISCONTINUED] esomeprazole (NEXIUM) 20 MG capsule Take  20 mg by mouth daily at 12 noon. (Patient not taking: Reported on 08/22/2023)   Facility-Administered Encounter Medications as of 08/22/2023  Medication   0.9 %  sodium chloride infusion   0.9 %  sodium chloride infusion    REVIEW OF SYSTEMS:  Gen: Denies fever, sweats or chills. No weight loss.  CV: See HPI.  Resp: Denies cough, shortness of breath of hemoptysis.  GI: See HPI.   GU: Denies urinary burning, blood in urine, increased urinary frequency or incontinence. MS:  No muscles aches or weakness. Derm: Denies rash, itchiness, skin lesions or unhealing ulcers. Psych: Denies depression, anxiety, memory loss or confusion. Heme: Denies bruising, easy bleeding. Neuro: No headaches or paresthesias.  Endo:  Denies any problems with DM, thyroid or adrenal function.  PHYSICAL EXAM: BP 110/80   Pulse 73   Ht 5\' 7"  (1.702 m)   Wt 254 lb 12.8 oz (115.6 kg)   BMI 39.91 kg/m  General: in no acute distress. Head: Normocephalic and atraumatic. Eyes:  Sclerae non-icteric, conjunctive pink. Ears: Normal auditory acuity. Mouth: Dentition intact. No ulcers or lesions.  Neck: Supple, no lymphadenopathy or thyromegaly.  Lungs: Clear bilaterally to auscultation without wheezes, crackles or rhonchi. Heart: Regular rate and rhythm. + Systolic murmur. No rub or gallop appreciated.  Abdomen: Soft, nontender, nondistended. No masses. No hepatosplenomegaly. Normoactive bowel sounds x 4 quadrants.  Rectal: Deferred.  Musculoskeletal: Symmetrical with no gross  deformities. Skin: Warm and dry. No rash or lesions on visible extremities. Extremities: No edema. Neurological: Alert oriented x 4, no focal deficits.  Psychological:  Alert and cooperative. Normal mood and affect.  ASSESSMENT AND PLAN:  52 year old female with a history of colon polyps. Colonoscopy 03/15/2017 showed a 35mm tubular adenomatous polyps with focal high grade dysplasia removed from the sigmoid colon. Her most recent colonoscopy 03/22/2018 which showed one benign lymphoid polyp removed from the sigmoid and two hyperplastic polyps removed from the sigmoid and rectum.  -Colonoscopy benefits and risks discussed including risk with sedation, risk of bleeding, perforation and infection  -CBC, CMP (not done recently)  -Further recommendations to be determined after colonoscopy completed   GERD, well controlled on Pantoprazole 20mg  QD  Hypertrophic cardiomyopathy on Camzyos with LV EF 55 - 60% -Patient has follow up appointment with cardiologist Dr. Regino Schultze with updated ECHO 08/2023 -Patient will contact our office if repeat ECHO shows any decline in LV EF  Atrial fib on Eliquis and Atenolol  -Our office will contact Dr. Regino Schultze to verify Eliquis hold instructions prior to proceeding with a colonoscopy          CC:  Jarrett Soho, PA-C

## 2023-08-23 ENCOUNTER — Emergency Department (HOSPITAL_BASED_OUTPATIENT_CLINIC_OR_DEPARTMENT_OTHER)
Admission: EM | Admit: 2023-08-23 | Discharge: 2023-08-23 | Disposition: A | Discharge status: Home / Self Care | Payer: BC Managed Care – PPO | Attending: Emergency Medicine | Admitting: Emergency Medicine

## 2023-08-23 DIAGNOSIS — S8262XA Displaced fracture of lateral malleolus of left fibula, initial encounter for closed fracture: Secondary | ICD-10-CM

## 2023-08-23 DIAGNOSIS — S63501A Unspecified sprain of right wrist, initial encounter: Secondary | ICD-10-CM

## 2023-08-23 DIAGNOSIS — Z7901 Long term (current) use of anticoagulants: Secondary | ICD-10-CM

## 2023-08-23 DIAGNOSIS — S0990XA Unspecified injury of head, initial encounter: Secondary | ICD-10-CM

## 2023-08-23 DIAGNOSIS — W19XXXA Unspecified fall, initial encounter: Secondary | ICD-10-CM

## 2023-08-23 NOTE — Discharge Instructions (Signed)
Wear Ace bandage for comfort and support.  Weightbearing as tolerated.  Ice for 20 minutes every 2 hours while awake for the next 2 days.  Take Tylenol 1000 mg every 6 hours as needed for pain.  Follow-up with primary doctor or orthopedist if not improving in the next 1 to 2 weeks.

## 2023-08-23 NOTE — ED Provider Notes (Signed)
**Note Denise-Identified via Obfuscation** [place] [facility] AT [facility] Provider Note   CSN: [id number] Arrival date & time: 08/22/23  2245     History  Chief Complaint  Patient presents with   [name] Tanvi Issac is a 52 y.o. female.  Patient is a 52 year old female with past medical history of hypertrophic cardiomyopathy, paroxysmal A-fib on Eliquis, hyperlipidemia.  Patient presenting today for evaluation of a fall.  She was going up stairs this evening when she was startled by an insect.  This caused her to lose her balance, fall against the railing, then fall backwards down several steps.  She reports striking her head during the fall.  She also injured her ankle and has significant pain and swelling.  No loss of consciousness.  She denies headache.  The history is provided by the patient.       Home Medications Prior to Admission medications   Medication Sig Start Date End Date Taking? Authorizing Provider  apixaban (ELIQUIS) 5 MG TABS tablet TAKE 1 TABLET BY MOUTH TWICE A DAY 05/02/23   Chandrasekhar, Mahesh A, MD  atenolol (TENORMIN) 25 MG tablet Take 1 tablet (25 mg total) by mouth daily. 02/14/22   Fenton, Clint R, PA  CAMZYOS 5 MG CAPS capsule Take 5 mg by mouth daily.    [provider]  clonazePAM (KLONOPIN) 0.5 MG tablet Take 1 tablet (0.5 mg total) by mouth 2 (two) times daily as needed for anxiety. 06/13/22   Cottle, Steva Ready., MD  losartan (COZAAR) 100 MG tablet Take 100 mg by mouth daily. 12/13/21   [provider]  Omega-3 Fatty Acids (FISH OIL) 1000 MG CAPS Take by mouth.    [provider]  pantoprazole (PROTONIX) 20 MG tablet Take 20 mg by mouth daily.    [provider]  PARoxetine (PAXIL) 30 MG tablet Take 2 tablets (60 mg total) by mouth daily. 06/04/23   Cottle, Steva Ready., MD  rosuvastatin (CRESTOR) 40 MG tablet Take 40 mg by mouth daily.    [provider]      Allergies    Ciprofloxacin    Review of Systems    Review of Systems  All other systems reviewed and are negative.   Physical Exam Updated Vital Signs BP (!) 119/93 (BP Location: Right Arm)   Pulse 64   Temp (!) 97.4 F (36.3 C)   Resp 18   Ht 5\' 7"  (1.702 m)   Wt 115.5 kg   SpO2 100%   BMI 39.88 kg/m  Physical Exam Vitals and nursing note reviewed.  Constitutional:      Appearance: Normal appearance.  Eyes:     Extraocular Movements: Extraocular movements intact.     Pupils: Pupils are equal, round, and reactive to light.  Pulmonary:     Effort: Pulmonary effort is normal.  Musculoskeletal:     Comments: The left ankle has swelling and ecchymosis extending from the lateral malleolus into the foot.  Neurological:     General: No focal deficit present.     Mental Status: She is alert and oriented to person, place, and time.     ED Results / Procedures / Treatments   Labs (all labs ordered are listed, but only abnormal results are displayed) Labs Reviewed - No data to display  EKG None  Radiology DG Ankle Complete Left  Result Date: 08/23/2023 CLINICAL DATA:  Left ankle pain after falling down 6 steps EXAM: LEFT ANKLE COMPLETE - 3+ VIEW COMPARISON:  Radiographs 09/30/2010 FINDINGS: Soft tissue swelling about the left ankle. Tiny avulsion fracture of the lateral malleolus. No widening of the ankle mortise. IMPRESSION: Tiny avulsion fracture of the lateral malleolus. Electronically Signed   By: Minerva Fester M.D.   On: 08/23/2023 01:00   DG Wrist Complete Right  Result Date: 08/23/2023 CLINICAL DATA:  Left ankle and right wrist pain after falling down 6 steps EXAM: RIGHT WRIST - COMPLETE 3+ VIEW COMPARISON:  None Available. FINDINGS: No acute fracture or dislocation. 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 IMPRESSION: No acute fracture or dislocation. Electronically Signed   By: Minerva Fester M.D.   On: 08/23/2023 00:58   CT Head Wo Contrast  Result Date: 08/23/2023 CLINICAL DATA:  Fall. EXAM: CT HEAD  WITHOUT CONTRAST TECHNIQUE: Contiguous axial images were obtained from the base of the skull through the vertex without intravenous contrast. RADIATION DOSE REDUCTION: This exam was performed according to the departmental dose-optimization program which includes automated exposure control, adjustment of the mA and/or kV according to patient size and/or use of iterative reconstruction technique. COMPARISON:  None Available. FINDINGS: Brain: No evidence of acute infarction, hemorrhage, hydrocephalus, extra-axial collection or mass lesion/mass effect. Vascular: No hyperdense vessel or unexpected calcification. Skull: Normal. Negative for fracture or focal lesion. Sinuses/Orbits: No acute finding. Other: None. IMPRESSION: No acute intracranial abnormality. Electronically Signed   By: Darliss Cheney M.D.   On: 08/23/2023 00:28    Procedures Procedures    Medications Ordered in ED Medications - No data to display  ED Course/ Medical Decision Making/ A&P  Patient presenting with complaints of fall as described in the HPI.  She is having pain in her right wrist, left ankle, and struck her head while on anticoagulation.  Patient arrives here with stable vital signs and is afebrile.  Neurologic exam is nonfocal.  She does have some swelling and tenderness to the left ankle.  CT scan of the head obtained showing no bleed or other abnormality.  X-ray of the right wrist is normal.  X-ray of the left ankle shows a small avulsion fracture off the distal fibula, but is otherwise unremarkable.  Patient to be discharged with rest, ice, compression, and elevation of the left ankle.  She is to follow-up as needed if not improving.  Final Clinical Impression(s) / ED Diagnoses Final diagnoses:  None    Rx / DC Orders ED Discharge Orders     None         Geoffery Lyons, MD 08/23/23 [phone]

## 2023-08-23 NOTE — ED Notes (Signed)
Mild swelling noted to lateral left ankle.

## 2023-09-10 ENCOUNTER — Telehealth: Payer: Self-pay | Admitting: Psychiatry

## 2023-09-10 ENCOUNTER — Other Ambulatory Visit: Payer: Self-pay

## 2023-09-10 DIAGNOSIS — F411 Generalized anxiety disorder: Secondary | ICD-10-CM

## 2023-09-10 DIAGNOSIS — F410 Panic disorder [episodic paroxysmal anxiety] without agoraphobia: Secondary | ICD-10-CM

## 2023-09-10 MED ORDER — CLONAZEPAM 0.5 MG PO TABS: 0.5000 mg | ORAL_TABLET | Freq: Two times a day (BID) | ORAL | 5 refills | Status: DC | PRN

## 2023-09-10 NOTE — Telephone Encounter (Signed)
Pended Klonopin to requested pharmacy.

## 2023-09-10 NOTE — Telephone Encounter (Signed)
Patient called in for refill on Klonopin 0.5mg . Ph: [phone] appt 8/13 Pharmacy [facility] [address] [place]

## 2023-09-24 ENCOUNTER — Telehealth: Payer: Self-pay | Admitting: Psychiatry

## 2023-09-24 ENCOUNTER — Other Ambulatory Visit: Payer: Self-pay

## 2023-09-24 DIAGNOSIS — F410 Panic disorder [episodic paroxysmal anxiety] without agoraphobia: Secondary | ICD-10-CM

## 2023-09-24 DIAGNOSIS — F411 Generalized anxiety disorder: Secondary | ICD-10-CM

## 2023-09-24 MED ORDER — CLONAZEPAM 0.5 MG PO TABS: 0.5000 mg | ORAL_TABLET | Freq: Two times a day (BID) | ORAL | 2 refills | Status: DC | PRN

## 2023-09-24 NOTE — Telephone Encounter (Signed)
Canceled at [facility] [address]. Pended to [address]

## 2023-09-24 NOTE — Telephone Encounter (Signed)
PT said that she prefers the teva generic brand of klonopin and the current [facility] [address] [facility] not [facility] that in stock. Can we transfer her RX for Klonopin to [facility] [address], since they have the teva brand.

## 2023-10-18 ENCOUNTER — Telehealth: Payer: Self-pay

## 2023-10-18 NOTE — Telephone Encounter (Signed)
[facility] Pre-operative Risk Assessment     Request for surgical clearance:     Endoscopy Procedure  What type of surgery is being performed?     Colonoscopy  When is this surgery scheduled?     12/06/23  What type of clearance is required ?   Pharmacy  Are there any medications that need to be held prior to surgery and how long? Eliquis & 2 days  Practice name and name of physician performing surgery?      [facility]  What is your office phone and fax number?      Phone- [phone]  Fax- [phone]  Anesthesia type (None, local, MAC, general) ?       MAC   Please route your response to Sinclair Alligood, CMA

## 2023-10-22 ENCOUNTER — Encounter: Payer: BC Managed Care – PPO | Admitting: Gastroenterology

## 2023-11-06 ENCOUNTER — Other Ambulatory Visit (HOSPITAL_COMMUNITY): Payer: Self-pay | Admitting: Internal Medicine

## 2023-11-07 NOTE — Telephone Encounter (Signed)
 Prescription refill request for Eliquis  received. Indication:afib Last office visit:5/24 Scr:1.15  10/24 Age: 53 Weight:115.5  kg  Prescription refilled

## 2023-11-14 ENCOUNTER — Telehealth: Payer: Self-pay | Admitting: Nurse Practitioner

## 2023-11-14 NOTE — Telephone Encounter (Signed)
Clearance has been refaxed to the cardiology office.

## 2023-11-14 NOTE — Telephone Encounter (Signed)
Pt chart was reviewed and noted that Clearance was requested. Pt was notified that our office will reach back out to her Cardiologist and follow up on the clearance that was previous requested. [name] please follow up on Clearance

## 2023-11-14 NOTE — Telephone Encounter (Signed)
Patient called and stated that she has a heart condition and was wondering if she needs a medical clearance before her colonoscopy procedure. Patient also stated that she currently see [facility] for her heart condition. Patient is requesting a call back. Please advise.

## 2023-11-26 NOTE — Progress Notes (Signed)
 Cardiology Office Note:    Date:  11/28/2023   ID:  [name], DOB November 03, 1970, MRN [id number]  PCP:  Katina Pfeiffer, PA-C   [facility] Providers Cardiologist:  Victory LELON Claudene DOUGLAS, MD (Inactive)     Referring MD: Katina Pfeiffer, PA-C   CC: Follow up Non-HCM care  History of Present Illness:    [name] is a 53 y.o. 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 [id number] Valdez on mavacamten with Dr. Cesario. Had MAC and did not want to start therapy for prevention.  [name] is a 53 year old female with hypertrophic cardiomyopathy who presents for follow-up regarding her medication management. She was referred by Dr. Prentice Cesario for follow-up on her hypertrophic cardiomyopathy management.  She has been managing hypertrophic cardiomyopathy and was started on Camzyos [facility]) in June, initially at 5 mg, which was increased to 15 mg by November. Due to lack of further improvement and potential metabolic issues, her dose was reduced to 10 mg when verapamil was introduced to replace atenolol , which was thought to potentially increase her left ventricular outflow tract (LVOT) gradient.  Her first echocardiogram on 15 mg in December showed a reduction in gradient from over 200 to around 127, but subsequent echocardiograms showed no further change. The switch to verapamil was made to potentially enhance the effects of Camzyos. She notes a change in her resting heart rate, which now fluctuates between 80 and 90, and sometimes reaches 101, compared to 50s and 60s on atenolol . She experiences a sensation of increased heart rate during activities such as cooking.  She has not been able to play pickleball since breaking her foot in October, but she has been out of the boot for two weeks. She notices shortness of breath when climbing stairs or  on inclines, although she does not need to stop. She felt some improvement in symptoms initially on Camzyos, but recent changes have been less favorable.  Her family history includes her husband with heart disease and a heart attack, and her daughter being treated for high cholesterol. Her son had an echocardiogram three years ago which was normal. She is currently on cholesterol medication, which was managed by her primary doctor, and she is on 40 mg of her current cholesterol medication.  Notable family events include: A son who is 56: he has had no evidence of HCM on imaging A daugther who has HTN, she sees [facility] and is on ARB with yearly echos   Past Medical History:  Diagnosis Date   Anxiety    Family history of breast cancer    GERD (gastroesophageal reflux disease)    Heart murmur    History of colonic polyps 05/27/2018   Hyperlipidemia    Hypertension    Hypertrophic obstructive cardiomyopathy 10/2019   Echocardiogram 10/2019: LVOT 120 mmHg // cMRI 10/2019: basal septum 21 mm, + SAM, no high risk features (no LGE; T2, ECV normal) // ETT 10/2019: no ischemic ST changes, normal BP response, no exercise induced VT // Cardiac monitor 11/2019: no ventricular arrhythmias    Panic disorder     Past Surgical History:  Procedure Laterality Date   COLONOSCOPY     DILATION AND CURETTAGE OF UTERUS     for miscarriage   POLYPECTOMY      Current Medications: Current Meds  Medication Sig  CAMZYOS 10 MG CAPS capsule Take 10 mg by mouth daily.   clonazePAM  (KLONOPIN ) 0.5 MG tablet Take 1 tablet (0.5 mg total) by mouth 2 (two) times daily as needed for anxiety.   ELIQUIS  5 MG TABS tablet TAKE 1 TABLET BY MOUTH TWICE A DAY   losartan (COZAAR) 100 MG tablet Take 100 mg by mouth daily.   Omega-3 Fatty Acids (FISH OIL) 1000 MG CAPS Take by mouth.   pantoprazole (PROTONIX) 20 MG tablet Take 20 mg by mouth daily.   PARoxetine  (PAXIL ) 30 MG tablet Take 2 tablets (60 mg total) by  mouth daily.   rosuvastatin (CRESTOR) 40 MG tablet Take 40 mg by mouth daily.   verapamil (CALAN-SR) 120 MG CR tablet Take 120 mg by mouth daily.   Current Facility-Administered Medications for the 11/28/23 encounter (Office Visit) with Santo Kelly A, MD  Medication   0.9 %  sodium chloride  infusion   0.9 %  sodium chloride  infusion     Allergies:   Ciprofloxacin   Social History   Socioeconomic History   Marital status: Married    Spouse name: Not on file   Number of children: 2   Years of education: Not on file   Highest education level: Not on file  Occupational History   Occupation: teacher   Tobacco Use   Smoking status: Never   Smokeless tobacco: Never   Tobacco comments:    Never smoke 12/07/22  Vaping Use   Vaping status: Never Used  Substance and Sexual Activity   Alcohol use: No   Drug use: No   Sexual activity: Not on file  Other Topics Concern   Not on file  Social History Narrative   Not on file   Social Drivers of Health   Financial Resource Strain: Not on file  Food Insecurity: Not on file  Transportation Needs: Not on file  Physical Activity: Not on file  Stress: Not on file  Social Connections: Not on file     Family History: The patient's family history includes Breast cancer (age of onset: 60) in her maternal aunt; Cancer (age of onset: 3) in her sister; Colon cancer in an other family member; Colon polyps in her father; Diabetes Mellitus I in her maternal grandmother; Diverticulitis in her mother; GI Bleed in her mother; Heart attack in her mother and paternal grandfather; Heart disease in her maternal grandmother; Hyperlipidemia in her mother; Hypertension in her father, mother, and sister; Irritable bowel syndrome in her mother; Lung cancer (age of onset: 22) in her maternal aunt; Other in her sister. There is no history of Esophageal cancer, Stomach cancer, Rectal cancer, or Sudden Cardiac Death.  ROS:   Please see the history of  present illness.      EKGs/Labs/Other Studies Reviewed:      Cardiac Studies & Procedures     STRESS TESTS  EXERCISE TOLERANCE TEST (ETT) 11/18/2019  Narrative  Blood pressure demonstrated a normal response to exercise.  There was no ST segment deviation noted during stress.  The patient walked for a total of 10 minutes and 1 seconds of a standard Bruce protocol treadmill test. She achieved a peak heart rate of 164 which is 95% predicted maximal heart rate.  She had no ST or T wave changes to suggest ischemia at peak exercise.  Her blood pressure response to exercise was normal.  There was no QRS widening at peak exercise.  This is interpreted as a negative stress test. She has no evidence  of inducible ischemia.  ECHOCARDIOGRAM  ECHOCARDIOGRAM COMPLETE 10/07/2019  Narrative ECHOCARDIOGRAM REPORT    Patient Name:   [facility] Date of Exam: 10/07/2019 Medical Rec #:  [id number]              Height:       67.0 in Accession #:    [id number]             Weight:       214.0 lb Date of Birth:  09-13-1971               BSA:          2.08 m Patient Age:    48 years               BP:           124/76 mmHg Patient Gender: F                      HR:           74 bpm. Exam Location:  [address]  Procedure: 3D Echo, 2D Echo, Cardiac Doppler and Color Doppler  Indications:    R01.1 Murmur  History:        [name] Echocardiogram examinations. Signs/Symptoms:Chest Pain; Risk Factors:Family History of Coronary Artery Disease, Hypertension and Dyslipidemia.  Sonographer:    Heather Hawks RDCS Referring Phys: [phone] VICTORY ORN [facility]  IMPRESSIONS   1. There is severe asymmetric hypertrophy of the basal septum. The LOVT gradient at rest by direct measurement is ~120 mmHG. Using the MR jet, a gradient of 115 mmg is estimated (assumed LA pressure of 15 mmHG). With valsalva the gradients do not appear to increase but the signal is contaminated by  MR. There is systolic anterior motion of the mitral valve. Findings are consistent with hypertrophic obstructive cardiomyopathy. Appears to be sigmoid variant. Would consider cardiac MR for better characterization/quantification and LGE assessment. 2. Left ventricular ejection fraction, by visual estimation, is 60 to 65%. The left ventricle has normal function. There is severely increased left ventricular hypertrophy. 3. Left ventricular diastolic parameters are consistent with Grade I diastolic dysfunction (impaired relaxation). 4. The left ventricle has no regional wall motion abnormalities. 5. Global right ventricle has normal systolic function.The right ventricular size is normal. No increase in right ventricular wall thickness. 6. Left atrial size was normal. 7. Right atrial size was normal. 8. Presence of pericardial fat pad. 9. Trivial pericardial effusion is present. 10. The mitral valve is degenerative. Mild mitral valve regurgitation. 11. The tricuspid valve is grossly normal. Tricuspid valve regurgitation is trivial. 12. The aortic valve is tricuspid. Aortic valve regurgitation is not visualized. No evidence of aortic valve sclerosis or stenosis. 13. The pulmonic valve was grossly normal. Pulmonic valve regurgitation is not visualized. 14. Mildly elevated pulmonary artery systolic pressure. 15. The tricuspid regurgitant velocity is 2.71 m/s, and with an assumed right atrial pressure of 3 mmHg, the estimated right ventricular systolic pressure is mildly elevated at 32.4 mmHg. 16. The inferior vena cava is normal in size with greater than 50% respiratory variability, suggesting right atrial pressure of 3 mmHg.  FINDINGS Left Ventricle: Left ventricular ejection fraction, by visual estimation, is 60 to 65%. The left ventricle has normal function. The left ventricle has no regional wall motion abnormalities. The left ventricular internal cavity size was the left ventricle is normal in  size. There is severely increased left ventricular hypertrophy. Asymmetric left  ventricular hypertrophy of the basal-septal wall. Left ventricular diastolic parameters are consistent with Grade I diastolic dysfunction (impaired relaxation). Normal left atrial pressure. There is severe asymmetric hypertrophy of the basal septum. The LOVT gradient at rest by direct measurement is ~120 mmHG. Using the MR jet, a gradient of 115 mmg is estimated (assumed LA pressure of 15 mmHG). With valsalva the gradients do not appear to increase but the signal is contaminated by MR. There is systolic anterior motion of the mitral valve. Findings are consistent with hypertrophic obstructive cardiomyopathy. Appears to be sigmoid variant. Would consider cardiac MR for better characterization/quantification and LGE assessment.  Right Ventricle: The right ventricular size is normal. No increase in right ventricular wall thickness. Global RV systolic function is has normal systolic function. The tricuspid regurgitant velocity is 2.71 m/s, and with an assumed right atrial pressure of 3 mmHg, the estimated right ventricular systolic pressure is mildly elevated at 32.4 mmHg.  Left Atrium: Left atrial size was normal in size.  Right Atrium: Right atrial size was normal in size  Pericardium: Trivial pericardial effusion is present. Presence of pericardial fat pad.  Mitral Valve: The mitral valve is degenerative in appearance. Mild mitral valve regurgitation. MV peak gradient, 7.3 mmHg.  Tricuspid Valve: The tricuspid valve is grossly normal. Tricuspid valve regurgitation is trivial.  Aortic Valve: The aortic valve is tricuspid. Aortic valve regurgitation is not visualized. The aortic valve is structurally normal, with no evidence of sclerosis or stenosis. Aortic valve mean gradient measures 46.0 mmHg. Aortic valve peak gradient measures 88.8 mmHg. Aortic valve area, by VTI measures 1.09 cm.  Pulmonic Valve: The pulmonic  valve was grossly normal. Pulmonic valve regurgitation is not visualized. Pulmonic regurgitation is not visualized.  Aorta: The aortic root and ascending aorta are structurally normal, with no evidence of dilitation.  Venous: The inferior vena cava is normal in size with greater than 50% respiratory variability, suggesting right atrial pressure of 3 mmHg.  IAS/Shunts: No atrial level shunt detected by color flow Doppler.   LEFT VENTRICLE PLAX 2D LVIDd:         2.94 cm  Diastology LVIDs:         2.08 cm  LV e' lateral:   7.94 cm/s LV PW:         1.20 cm  LV E/e' lateral: 14.4 LV IVS:        1.90 cm  LV e' medial:    5.22 cm/s LVOT diam:     2.30 cm  LV E/e' medial:  21.8 LV SV:         19 ml LV SV Index:   8.84 LVOT Area:     4.15 cm   RIGHT VENTRICLE RV S prime:     12.50 cm/s TAPSE (M-mode): 1.7 cm  LEFT ATRIUM             Index       RIGHT ATRIUM           Index LA diam:        3.80 cm 1.83 cm/m  RA Area:     21.40 cm LA Vol (A2C):   88.0 ml 42.28 ml/m RA Volume:   62.80 ml  30.17 ml/m LA Vol (A4C):   82.6 ml 39.69 ml/m LA Biplane Vol: 86.5 ml 41.56 ml/m AORTIC VALVE AV Area (Vmax):    1.57 cm AV Area (Vmean):   1.80 cm AV Area (VTI):     1.09 cm AV Vmax:  471.25 cm/s AV Vmean:          306.500 cm/s AV VTI:            1.112 m AV Peak Grad:      88.8 mmHg AV Mean Grad:      46.0 mmHg LVOT Vmax:         178.33 cm/s LVOT Vmean:        133.000 cm/s LVOT VTI:          0.293 m LVOT/AV VTI ratio: 0.26  AORTA Ao Root diam: 3.40 cm Ao Asc diam:  3.50 cm  MITRAL VALVE                         TRICUSPID VALVE MV Area (PHT): 2.08 cm              TR Peak grad:   29.4 mmHg MV Peak grad:  7.3 mmHg              TR Vmax:        341.00 cm/s MV Mean grad:  2.0 mmHg MV Vmax:       1.35 m/s              SHUNTS MV Vmean:      61.9 cm/s             Systemic VTI:  0.29 m MV VTI:        0.40 m                Systemic Diam: 2.30 cm MV PHT:        106 msec MV Decel  Time: 363 msec MR Peak grad: 211.4 mmHg MR Mean grad: 131.0 mmHg MR Vmax:      727.00 cm/s MR Vmean:     520.0 cm/s MV E velocity: 114.00 cm/s 103 cm/s MV A velocity: 83.60 cm/s  70.3 cm/s MV E/A ratio:  1.36        1.5   Darryle Decent MD Electronically signed by Darryle Decent MD Signature Date/Time: 10/07/2019/11:11:47 AM    Final   MONITORS  LONG TERM MONITOR (3-14 DAYS) 12/11/2019  Narrative  Normal sinus rhythm  Rare PACs  Rare nonsustained SVT less than 10 beats  No significant ventricular arrhythmia  No symptoms reported    Overall, benign study without high risk features in the setting of hypertrophic cardiomyopathy.  CT SCANS  CT CARDIAC SCORING (SELF PAY ONLY) 03/16/2023  Addendum 03/24/2023  1:01 PM ADDENDUM REPORT: 03/24/2023 12:59  EXAM: OVER-READ INTERPRETATION  CT CHEST  The following report is an over-read performed by radiologist Dr. Andrea Gasman of [facility] on 03/24/2023. This over-read does not include interpretation of cardiac or coronary anatomy or pathology. The coronary calcium score interpretation by the cardiologist is attached.  COMPARISON:  None.  FINDINGS: Vascular: No aortic atherosclerosis. The included aorta is normal in caliber.  Mediastinum/nodes: Calcified mediastinal and right hilar lymph nodes consistent with prior granulomatous disease unremarkable esophagus.  Lungs: No focal airspace disease. Benign calcified granuloma in the right middle lobe. No pleural fluid. The included airways are patent.  Upper abdomen: No acute or unexpected findings.  Musculoskeletal: There are no acute or suspicious osseous abnormalities.  IMPRESSION: Sequela of prior granulomatous disease with calcified nodule in the right middle lobe and calcified mediastinal and right hilar lymph nodes. This is considered benign, and needs no further imaging follow-up.   Electronically Signed By: Andrea Gasman  M.D. On:  03/24/2023 12:59  Narrative CLINICAL DATA:  78F for cardiovascular disease risk stratification  EXAM: Coronary Calcium Score  TECHNIQUE: A gated, non-contrast computed tomography scan of the heart was performed using 3mm slice thickness. Axial images were analyzed on a dedicated workstation. Calcium scoring of the coronary arteries was performed using the Agatston method.  FINDINGS: Coronary Calcium Score: 0  Pericardium: Normal.  Mitral annular calcification.  Non-cardiac: See separate report from [facility].  IMPRESSION: 1. Coronary calcium score of 0. This was 1st percentile for age-, race-, and sex-matched controls.  2. Mitral annular calcification.  RECOMMENDATIONS: Coronary artery calcium (CAC) score is a strong predictor of incident coronary heart disease (CHD) and provides predictive information beyond traditional risk factors. CAC scoring is reasonable to use in the decision to withhold, postpone, or initiate statin therapy in intermediate-risk or selected borderline-risk asymptomatic adults (age 75-75 years and LDL-C >=70 to <190 mg/dL) who do not have diabetes or established atherosclerotic cardiovascular disease (ASCVD).* In intermediate-risk (10-year ASCVD risk >=7.5% to <20%) adults or selected borderline-risk (10-year ASCVD risk >=5% to <7.5%) adults in whom a CAC score is measured for the purpose of making a treatment decision the following recommendations have been made:  If CAC=0, it is reasonable to withhold statin therapy and reassess in 5 to 10 years, as long as higher risk conditions are absent (diabetes mellitus, family history of premature CHD in first degree relatives (males <55 years; females <65 years), cigarette smoking, or LDL >=190 mg/dL).  If CAC is 1 to 99, it is reasonable to initiate statin therapy for patients >=14 years of age.  If CAC is >=100 or >=75th percentile, it is reasonable to initiate statin therapy at any  age.  Cardiology referral should be considered for patients with CAC scores >=400 or >=75th percentile.  *2018 AHA/ACC/AACVPR/AAPA/ABC/ACPM/ADA/AGS/APhA/ASPC/NLA/PCNA Guideline on the Management of Blood Cholesterol: A Report of the American College of Cardiology/American Heart Association Task Force on Clinical Practice Guidelines. J Am Coll Cardiol. 2019;73(24):3168-3209.  Annabella Scarce, MD  Electronically Signed: By: Annabella Scarce M.D. On: 03/20/2023 12:42  CARDIAC MRI  MR CARDIAC MORPHOLOGY W WO CONTRAST 11/06/2019  Narrative CLINICAL DATA:  Hypertrophic Cardiomyopathy  EXAM: CARDIAC MRI  TECHNIQUE: The patient was scanned on a 1.5 Tesla Siemens magnet. A dedicated cardiac coil was used. Functional imaging was done using Fiesta sequences. 2,3, and 4 chamber views were done to assess for RWMA's. Modified Simpson's rule using a short axis stack was used to calculate an ejection fraction on a dedicated work [employer]. The patient received 12 cc of Gadavist  after 10 minutes inversion recovery sequences were used to assess for infiltration and scar tissue.  CONTRAST:  Gadavist   FINDINGS: Mild LAE. Normal RA/RV size and function No ASD/PFO No pericardial effusion Normal aortic root 3.2 cm. Mild LVE. Normal quantitative EF 74% (EDV 115 cc ESV 30 cc SV 86 cc) Morphologically patient has hypertrophic cardiomyopathy. There is severe basal septal hypertrophy 21 mm compared to posterior wall 11 mm. There is an elongated anterior mitral leaflet with SAM. The LVOT gradient actually starts with abutment of a large hypertrophied and anteriorly displaced papillary muscle There is mild associated mitral regurgitation. The aortic valve itself was not well seen in series obtained but appears tri leaflet with some leaflet thickening and no stenosis in particular at valve level.  Advanced measurement of high risk features were negative. There was no delayed  gadolinium uptake on inversion recovery sequences. The global T1 time (  1055 msec) and the T1 time in septum (1033) are mildly increased. The T2 time in septum and posterior wall are normal 40-51 msec. The calculated ECV is in normal range as well 19% using a Hct of 39  IMPRESSION: 1. Mild LVE with severe asymmetric basal septal hypertrophy measuring 21 mm. Normal LVEF 74%  2. SAM of anterior mitral valve leaflet with LVOT gradient created by anterior mitral leaflet and large hypertrophied anteriorly displaced papillary muscle Suggest echo correlation  3.  Mild MR  4.  Mild LAE  5. No high risk features other than septal thickness with no delayed gadolinium uptake in septum, mildly prolonged T1 times and normal T2 and ECV  Maude Emmer   Electronically Signed By: Maude Emmer M.D. On: 11/06/2019 14:48          Recent Labs: 08/22/2023: ALT 13; BUN 23; Creatinine, Ser 1.15; Hemoglobin 12.9; Platelets 209.0; Potassium 4.1; Sodium 140  Recent Lipid Panel    Component Value Date/Time   CHOL 179 03/05/2023 0721   TRIG 73 03/05/2023 0721   HDL 72 03/05/2023 0721   CHOLHDL 2.5 03/05/2023 0721   LDLCALC 93 03/05/2023 0721    Physical Exam:    VS:  BP (!) 140/82   Pulse 80   Ht 5' 7 (1.702 m)   Wt 256 lb (116.1 kg)   BMI 40.10 kg/m     Wt Readings from Last 3 Encounters:  11/28/23 256 lb (116.1 kg)  08/22/23 254 lb 10.1 oz (115.5 kg)  08/22/23 254 lb 12.8 oz (115.6 kg)    GEN:  Well nourished, well developed, morbid obesity HEENT: Normal NECK: No JVD CARDIAC: RRR, no rubs, gallops, resting harsh systolic murmur worse with Valsalva RESPIRATORY:  Clear to auscultation without rales, wheezing or rhonchi  ABDOMEN: Soft, non-tender, non-distended MUSCULOSKELETAL:  No edema; No deformity  SKIN: Warm and dry NEUROLOGIC:  Alert and oriented x 3 PSYCHIATRIC:  Normal affect   ASSESSMENT:    1. Hyperlipidemia, unspecified hyperlipidemia type   2. Hypertrophic  obstructive cardiomyopathy (HOCM) (HCC)   3. Paroxysmal atrial fibrillation (HCC)     PLAN:    Hypertrophic Cardiomyopathy - with HTN - Sigmoid septal Variant - managed by Dr. Prentice Flatten, [facility], on verapamil and mavacamten - SAM associated MR only per report - suspicion of Fabry's/Danon or other mimics of HCM: low - Gene variant: Negative   - NYHA II - Non HCM Contributors to disease/status (minimal, weight gain potentially) - HTN: on Losartan for BP control with INHERIT trial data, slightly elevated today; given her CCB titration will not increase at this time.  Family history, Discussed family screening  (Gene negative, Daugther yearly echos, son echo every 5 years) No hx of SCD in family  SCD  Assessment - Exercise testing normal for normal rhythm  - CMR from 2021 notable for no LGE -  2 year assessment for VT on rhythm monitor with no NSVT - SCD risk is low - repeat CMR and ziopatch- discussed with Dr. Flatten; will do it here  Atrial fibrillation (PAF, rare, CHADVASC NA) - Atrial arrhythmia management: continue CCB; continue eliquis   HLD MAC - on rosuvastatin - will get lpa and fasting lipid in June, may start zetia  10 mg - reviewed her CAC score with her  Time Spent Directly with Patient:   I have spent a total of 41 minutes with the patient reviewing notes, imaging, EKGs, labs, CAC score and examining the patient as well as establishing an assessment  and plan that was discussed personally with the patient. Discussed disease state education and reviwed SRT. Reviewed care and plan in collaboration with [facility] (Dr. Cesario).       Medication Adjustments/Labs and Tests Ordered: Current medicines are reviewed at length with the patient today.  Concerns regarding medicines are outlined above.  Orders Placed This Encounter  Procedures   MR CARDIAC MORPHOLOGY W WO CONTRAST   Lipoprotein A (LPA)   Lipid panel   CBC   No orders of the defined types were placed in this  encounter.   Patient Instructions  Medication Instructions:  Your physician recommends that you continue on your current medications as directed. Please refer to the Current Medication list given to you today.  *If you need a refill on your cardiac medications before your next appointment, please call your pharmacy*   Lab Work: JUNE 2025: Fasting lipid panel, LPA (nothing to eat or drink 12 hours prior except water)   If you have labs (blood work) drawn today and your tests are completely normal, you will receive your results only by: MyChart Message (if you have MyChart) OR A paper copy in the mail If you have any lab test that is abnormal or we need to change your treatment, we will call you to review the results.   Testing/Procedures: Your physician has requested that you have a cardiac MRI. Cardiac MRI uses a computer to create images of your heart as its beating, producing both still and moving pictures of your heart and major blood vessels. For further information please visit [email or web address]. Please follow the instruction sheet given to you today for more information.   Your physician has requested that you wear a heart monitor.    Follow-Up: At [facility], you and your health needs are our priority.  As part of our continuing mission to provide you with exceptional heart care, we have created designated Provider Care Teams.  These Care Teams include your primary Cardiologist (physician) and Advanced Practice Providers (APPs -  Physician Assistants and Nurse Practitioners) who all work together to provide you with the care you need, when you need it.    Your next appointment:   1 year(s)  Provider:   Stanly Leavens, MD   Other Instructions   You are scheduled for Cardiac MRI at the location below.  Please arrive for your appointment at ______________ . ?  [facility] [address] [place], [place] [address] Please take advantage of  the free valet parking available at the [facility] and [employer] (Entrance C).  Proceed to the [facility] Radiology Department (First Floor) for check-in.   OR   [facility] [address] [place], [place] [address] Please go to the [facility] and check-in with the desk attendant.   Magnetic resonance imaging (MRI) is a painless test that produces images of the inside of the body without using Xrays.  During an MRI, strong magnets and radio waves work together in a data processing manager to form detailed images.   MRI images may provide more details about a medical condition than X-rays, CT scans, and ultrasounds can provide.  You may be given earphones to listen for instructions.  You may eat a light breakfast and take medications as ordered with the exception of furosemide, hydrochlorothiazide, chlorthalidone or spironolactone (or any other fluid pill). If you are undergoing a stress MRI, please avoid stimulants for 12 hr prior to test. (I.e. Caffeine, nicotine, chocolate, or  antihistamine medications)  An IV will be inserted into one of your veins. Contrast material will be injected into your IV. It will leave your body through your urine within a day. You may be told to drink plenty of fluids to help flush the contrast material out of your system.  You will be asked to remove all metal, including: Watch, jewelry, and other metal objects including hearing aids, hair pieces and dentures. Also wearable glucose monitoring systems (ie. Freestyle Libre and Omnipods) (Braces and fillings normally are not a problem.)   TEST WILL TAKE APPROXIMATELY 1 HOUR  PLEASE NOTIFY SCHEDULING AT LEAST 24 HOURS IN ADVANCE IF YOU ARE UNABLE TO KEEP YOUR APPOINTMENT. [phone]  For more information and frequently asked questions, please visit our website : [email or web address]  Please call the Cardiac Imaging Nurse Navigators with any  questions/concerns. [phone] Office      ZIO XT- Long Term Monitor Instructions  Your physician has requested you wear a ZIO patch monitor for 14 days.  This is a single patch monitor. [employer] supplies one patch monitor per enrollment. Additional stickers are not available. Please do not apply patch if you will be having a Nuclear Stress Test,   Cardiac CT, MRI, or Chest Xray during the period you would be wearing the  monitor. The patch cannot be worn during these tests. You cannot remove and re-apply the  ZIO XT patch monitor.  Your ZIO patch monitor will be mailed 3 day USPS to your address on file. It may take 3-5 days  to receive your monitor after you have been enrolled.  Once you have received your monitor, please review the enclosed instructions. Your monitor  has already been registered assigning a specific monitor serial # to you.  Billing and Patient Assistance Program Information  We have supplied [employer] with any of your insurance information on file for billing purposes. [employer] offers a sliding scale Patient Assistance Program for patients that do not have  insurance, or whose insurance does not completely cover the cost of the ZIO monitor.  You must apply for the Patient Assistance Program to qualify for this discounted rate.  To apply, please call [employer] at [phone], select option 4, select option 2, ask to apply for  Patient Assistance Program. Meredeth will ask your household income, and how many people  are in your household. They will quote your out-of-pocket cost based on that information.  [employer] will also be able to set up a 79-month, interest-free payment plan if needed.  Applying the monitor   Shave hair from upper left chest.  Hold abrader disc by orange tab. Rub abrader in 40 strokes over the upper left chest as  indicated in your monitor instructions.  Clean area with 4 enclosed alcohol pads. Let dry.  Apply patch as indicated in monitor  instructions. Patch will be placed under collarbone on left  side of chest with arrow pointing upward.  Rub patch adhesive wings for 2 minutes. Remove white label marked 1. Remove the white  label marked 2. Rub patch adhesive wings for 2 additional minutes.  While looking in a mirror, press and release button in center of patch. A small green light will  flash 3-4 times. This will be your only indicator that the monitor has been turned on.  Do not shower for the first 24 hours. You may shower after the first 24 hours.  Press the button if you feel a symptom. You will hear a small click. Record Date, Time  and  Symptom in the Patient Logbook.  When you are ready to remove the patch, follow instructions on the last 2 pages of Patient  Logbook. Stick patch monitor onto the last page of Patient Logbook.  Place Patient Logbook in the blue and white box. Use locking tab on box and tape box closed  securely. The blue and white box has prepaid postage on it. Please place it in the mailbox as  soon as possible. Your physician should have your test results approximately 7 days after the  monitor has been mailed back to [facility].  Call [facility] Customer Care at [phone] if you have questions regarding  your ZIO XT patch monitor. Call them immediately if you see an orange light blinking on your  monitor.  If your monitor falls off in less than 4 days, contact our Monitor department at [phone].  If your monitor becomes loose or falls off after 4 days call [employer] at [phone] for  suggestions on securing your monitor          Signed, Stanly DELENA Leavens, MD  11/28/2023 5:25 PM    [facility]

## 2023-11-27 NOTE — Telephone Encounter (Signed)
Inbound call from patient stating cardiology has not received request for clearance. Stated fax number is [phone]. Please advise, thank you.

## 2023-11-28 ENCOUNTER — Encounter: Payer: Self-pay | Admitting: Gastroenterology

## 2023-11-28 ENCOUNTER — Ambulatory Visit: Payer: 59 | Attending: Internal Medicine | Admitting: Internal Medicine

## 2023-11-28 ENCOUNTER — Other Ambulatory Visit: Payer: Self-pay | Admitting: Internal Medicine

## 2023-11-28 ENCOUNTER — Encounter: Payer: Self-pay | Admitting: Internal Medicine

## 2023-11-28 ENCOUNTER — Telehealth: Payer: Self-pay

## 2023-11-28 ENCOUNTER — Ambulatory Visit (INDEPENDENT_AMBULATORY_CARE_PROVIDER_SITE_OTHER): Payer: 59

## 2023-11-28 VITALS — BP 140/82 | HR 80 | Ht 67.0 in | Wt 256.0 lb

## 2023-11-28 DIAGNOSIS — I480 Paroxysmal atrial fibrillation: Secondary | ICD-10-CM

## 2023-11-28 DIAGNOSIS — E785 Hyperlipidemia, unspecified: Secondary | ICD-10-CM

## 2023-11-28 DIAGNOSIS — I421 Obstructive hypertrophic cardiomyopathy: Secondary | ICD-10-CM

## 2023-11-28 NOTE — Telephone Encounter (Signed)
 Inbound call from patient requesting a call regarding cardiology clearance. States  Dr. Paulita Boss has not received request. Please advise, thank you.

## 2023-11-28 NOTE — Patient Instructions (Addendum)
 Medication Instructions:  Your physician recommends that you continue on your current medications as directed. Please refer to the Current Medication list given to you today.  *If you need a refill on your cardiac medications before your next appointment, please call your pharmacy*   Lab Work: JUNE 2025: Fasting lipid panel, LPA (nothing to eat or drink 12 hours prior except water)   If you have labs (blood work) drawn today and your tests are completely normal, you will receive your results only by: MyChart Message (if you have MyChart) OR A paper copy in the mail If you have any lab test that is abnormal or we need to change your treatment, we will call you to review the results.   Testing/Procedures: Your physician has requested that you have a cardiac MRI. Cardiac MRI uses a computer to create images of your heart as its beating, producing both still and moving pictures of your heart and major blood vessels. For further information please visit [email or web address]. Please follow the instruction sheet given to you today for more information.   Your physician has requested that you wear a heart monitor.    Follow-Up: At [facility], you and your health needs are our priority.  As part of our continuing mission to provide you with exceptional heart care, we have created designated Provider Care Teams.  These Care Teams include your primary Cardiologist (physician) and Advanced Practice Providers (APPs -  Physician Assistants and Nurse Practitioners) who all work together to provide you with the care you need, when you need it.    Your next appointment:   1 year(s)  Provider:   Stanly Leavens, MD   Other Instructions   You are scheduled for Cardiac MRI at the location below.  Please arrive for your appointment at ______________ . ?  [facility] [address] [place], [place] [address] Please take advantage of the free valet parking available at the  [facility] and [employer] (Entrance C).  Proceed to the [facility] Radiology Department (First Floor) for check-in.   OR   [facility] [address] [place], [place] [address] Please go to the [facility] and check-in with the desk attendant.   Magnetic resonance imaging (MRI) is a painless test that produces images of the inside of the body without using Xrays.  During an MRI, strong magnets and radio waves work together in a data processing manager to form detailed images.   MRI images may provide more details about a medical condition than X-rays, CT scans, and ultrasounds can provide.  You may be given earphones to listen for instructions.  You may eat a light breakfast and take medications as ordered with the exception of furosemide, hydrochlorothiazide, chlorthalidone or spironolactone (or any other fluid pill). If you are undergoing a stress MRI, please avoid stimulants for 12 hr prior to test. (I.e. Caffeine, nicotine, chocolate, or antihistamine medications)  An IV will be inserted into one of your veins. Contrast material will be injected into your IV. It will leave your body through your urine within a day. You may be told to drink plenty of fluids to help flush the contrast material out of your system.  You will be asked to remove all metal, including: Watch, jewelry, and other metal objects including hearing aids, hair pieces and dentures. Also wearable glucose monitoring systems (ie. Freestyle Libre and Omnipods) (Braces and fillings normally are not a problem.)   TEST WILL TAKE APPROXIMATELY 1 HOUR  PLEASE NOTIFY SCHEDULING AT LEAST 24 HOURS IN ADVANCE IF YOU ARE UNABLE TO KEEP YOUR APPOINTMENT. [phone]  For more information and frequently asked questions, please visit our website : [email or web address]  Please call the Cardiac Imaging Nurse Navigators with any questions/concerns. [phone] Office      ZIO XT- Long Term  Monitor Instructions  Your physician has requested you wear a ZIO patch monitor for 14 days.  This is a single patch monitor. [employer] supplies one patch monitor per enrollment. Additional stickers are not available. Please do not apply patch if you will be having a Nuclear Stress Test,   Cardiac CT, MRI, or Chest Xray during the period you would be wearing the  monitor. The patch cannot be worn during these tests. You cannot remove and re-apply the  ZIO XT patch monitor.  Your ZIO patch monitor will be mailed 3 day USPS to your address on file. It may take 3-5 days  to receive your monitor after you have been enrolled.  Once you have received your monitor, please review the enclosed instructions. Your monitor  has already been registered assigning a specific monitor serial # to you.  Billing and Patient Assistance Program Information  We have supplied [employer] with any of your insurance information on file for billing purposes. [employer] offers a sliding scale Patient Assistance Program for patients that do not have  insurance, or whose insurance does not completely cover the cost of the ZIO monitor.  You must apply for the Patient Assistance Program to qualify for this discounted rate.  To apply, please call [employer] at [phone], select option 4, select option 2, ask to apply for  Patient Assistance Program. Meredeth will ask your household income, and how many people  are in your household. They will quote your out-of-pocket cost based on that information.  [employer] will also be able to set up a 42-month, interest-free payment plan if needed.  Applying the monitor   Shave hair from upper left chest.  Hold abrader disc by orange tab. Rub abrader in 40 strokes over the upper left chest as  indicated in your monitor instructions.  Clean area with 4 enclosed alcohol pads. Let dry.  Apply patch as indicated in monitor instructions. Patch will be placed under collarbone on left  side of  chest with arrow pointing upward.  Rub patch adhesive wings for 2 minutes. Remove white label marked 1. Remove the white  label marked 2. Rub patch adhesive wings for 2 additional minutes.  While looking in a mirror, press and release button in center of patch. A small green light will  flash 3-4 times. This will be your only indicator that the monitor has been turned on.  Do not shower for the first 24 hours. You may shower after the first 24 hours.  Press the button if you feel a symptom. You will hear a small click. Record Date, Time and  Symptom in the Patient Logbook.  When you are ready to remove the patch, follow instructions on the last 2 pages of Patient  Logbook. Stick patch monitor onto the last page of Patient Logbook.  Place Patient Logbook in the blue and white box. Use locking tab on box and tape box closed  securely. The blue and white box has prepaid postage on it. Please place it in the mailbox as  soon as possible. Your physician should have your test results approximately 7 days after the  monitor has been mailed back to [facility].  Call  [employer] Customer Care at [phone] if you have questions regarding  your ZIO XT patch monitor. Call them immediately if you see an orange light blinking on your  monitor.  If your monitor falls off in less than 4 days, contact our Monitor department at [phone].  If your monitor becomes loose or falls off after 4 days call [employer] at [phone] for  suggestions on securing your monitor

## 2023-11-28 NOTE — Telephone Encounter (Signed)
 Clearance has been sent to Cardiology. Attempted to contact patient and left a voicemail.

## 2023-11-28 NOTE — Telephone Encounter (Signed)
 [facility] Pre-operative Risk Assessment     Request for surgical clearance:     Endoscopy Procedure  What type of surgery is being performed?     Colonoscopy  When is this surgery scheduled?     12/06/23  What type of clearance is required ?   Pharmacy  Are there any medications that need to be held prior to surgery and how long? Eliquis & 2 days  Practice name and name of physician performing surgery?      [facility]  What is your office phone and fax number?      Phone- [phone]  Fax- [phone]  Anesthesia type (None, local, MAC, general) ?       MAC   Please route your response to Sinclair Alligood, CMA

## 2023-11-28 NOTE — Progress Notes (Unsigned)
 Enrolled for [employer] to mail a ZIO XT long term holter monitor to the patients address on file.

## 2023-11-29 NOTE — Telephone Encounter (Signed)
 Patient with diagnosis of afib on Eliquis  for anticoagulation.    Procedure: colonoscopy Date of procedure: 12/06/23   CHA2DS2-VASc Score = 2   This indicates a 2.2% annual risk of stroke. The patient's score is based upon: CHF History: 0 HTN History: 1 Diabetes History: 0 Stroke History: 0 Vascular Disease History: 0 Age Score: 0 Gender Score: 1       CrCl 75 ml/min Platelet count 209  Per office protocol, patient can hold Eliquis  for 2 days prior to procedure.    **This guidance is not considered finalized until pre-operative APP has relayed final recommendations.**

## 2023-11-29 NOTE — Telephone Encounter (Signed)
   Patient Name: [name]  DOB: Jul 02, 1971 MRN: [id number]  Primary Cardiologist: Victory LELON Claudene DOUGLAS, MD (Inactive)  Clinical pharmacists have reviewed the patient's past medical history, labs, and current medications as part of preoperative protocol coverage. The following recommendations have been made:  Patient with diagnosis of afib on Eliquis  for anticoagulation.     Procedure: colonoscopy Date of procedure: 12/06/23  CHA2DS2-VASc Score = 2   This indicates a 2.2% annual risk of stroke. The patient's score is based upon: CHF History: 0 HTN History: 1 Diabetes History: 0 Stroke History: 0 Vascular Disease History: 0 Age Score: 0 Gender Score: 1   CrCl 75 ml/min Platelet count 209   Per office protocol, patient can hold Eliquis  for 2 days prior to procedure.  Please resume when safe to do so from a bleeding standpoint.   I will route this recommendation to the requesting party via Epic fax function and remove from pre-op pool.  Please call with questions.  Denise Bena D Joene Gelder, NP 11/29/2023, 11:02 AM

## 2023-11-29 NOTE — Telephone Encounter (Signed)
 Contacted patient and patient is aware to hold Eliquis  2 days prior to her procedure per Cardiology.

## 2023-12-06 ENCOUNTER — Ambulatory Visit: Payer: 59 | Admitting: Gastroenterology

## 2023-12-06 ENCOUNTER — Encounter: Payer: Self-pay | Admitting: Gastroenterology

## 2023-12-06 VITALS — BP 129/75 | HR 75 | Temp 97.9°F | Resp 17 | Ht 67.0 in | Wt 254.0 lb

## 2023-12-06 DIAGNOSIS — K644 Residual hemorrhoidal skin tags: Secondary | ICD-10-CM | POA: Diagnosis not present

## 2023-12-06 DIAGNOSIS — Z1211 Encounter for screening for malignant neoplasm of colon: Secondary | ICD-10-CM

## 2023-12-06 DIAGNOSIS — Z860100 Personal history of colon polyps, unspecified: Secondary | ICD-10-CM

## 2023-12-06 DIAGNOSIS — K5730 Diverticulosis of large intestine without perforation or abscess without bleeding: Secondary | ICD-10-CM | POA: Diagnosis not present

## 2023-12-06 DIAGNOSIS — K648 Other hemorrhoids: Secondary | ICD-10-CM

## 2023-12-06 DIAGNOSIS — Z860101 Personal history of adenomatous and serrated colon polyps: Secondary | ICD-10-CM

## 2023-12-06 MED ORDER — SODIUM CHLORIDE 0.9 % IV SOLN: 500.0000 mL | INTRAVENOUS | Status: DC

## 2023-12-06 NOTE — Patient Instructions (Addendum)
Resume previous diet Continue present medications Resume Eliquis TODAY AT PRIOR DOSE There were no colon polyps seen today!  You will need another screening colonoscopy in 5 years, you will receive a letter at that time when you are due for the procedure.   Please call us at [phone] if you have a change in bowel habits, change in family history of colo-rectal cancer, rectal bleeding or other GI concern before that time. Handouts/information given for diverticulosis and hemorrhoids  YOU HAD AN ENDOSCOPIC PROCEDURE TODAY AT THE [facility]:   Refer to the procedure report that was given to you for any specific questions about what was found during the examination.  If the procedure report does not answer your questions, please call your gastroenterologist to clarify.  If you requested that your care partner not be given the details of your procedure findings, then the procedure report has been included in a sealed envelope for you to review at your convenience later.  YOU SHOULD EXPECT: Some feelings of bloating in the abdomen. Passage of more gas than usual.  Walking can help get rid of the air that was put into your GI tract during the procedure and reduce the bloating. If you had a lower endoscopy (such as a colonoscopy or flexible sigmoidoscopy) you may notice spotting of blood in your stool or on the toilet paper. If you underwent a bowel prep for your procedure, you may not have a normal bowel movement for a few days.  Please Note:  You might notice some irritation and congestion in your nose or some drainage.  This is from the oxygen used during your procedure.  There is no need for concern and it should clear up in a day or so.  SYMPTOMS TO REPORT IMMEDIATELY: Following lower endoscopy (colonoscopy):  Excessive amounts of blood in the stool  Significant tenderness or worsening of abdominal pains  Swelling of the abdomen that is new, acute  Fever of 100F or higher  For  urgent or emergent issues, a gastroenterologist can be reached at any hour by calling [phone]. Do not use MyChart messaging for urgent concerns.   DIET:  We do recommend a small meal at first, but then you may proceed to your regular diet.  Drink plenty of fluids but you should avoid alcoholic beverages for 24 hours.  ACTIVITY:  You should plan to take it easy for the rest of today and you should NOT DRIVE or use heavy machinery until tomorrow (because of the sedation medicines used during the test).    FOLLOW UP: Our staff will call the number listed on your records the next business day following your procedure.  We will call around 7:15- 8:00 am to check on you and address any questions or concerns that you may have regarding the information given to you following your procedure. If we do not reach you, we will leave a message.      SIGNATURES/CONFIDENTIALITY: You and/or your care partner have signed paperwork which will be entered into your electronic medical record.  These signatures attest to the fact that that the information above on your After Visit Summary has been reviewed and is understood.  Full responsibility of the confidentiality of this discharge information lies with you and/or your care-partner.

## 2023-12-06 NOTE — Op Note (Signed)
[facility] Patient Name: Beatryce Colombo Procedure Date: 12/06/2023 7:53 AM MRN: [id number] Endoscopist: Napoleon Form , MD, [id number] Age: 53 Referring MD:  Date of Birth: [birth year] Gender: Female Account #: [id number] Procedure:                Colonoscopy Indications:              High risk colon cancer surveillance: Personal                            history of adenoma (10 mm or greater in size), High                            risk colon cancer surveillance: Personal history of                            multiple (3 or more) adenomas Medicines:                Monitored Anesthesia Care Procedure:                Pre-Anesthesia Assessment:                           - Prior to the procedure, a History and Physical                            was performed, and patient medications and                            allergies were reviewed. The patient's tolerance of                            previous anesthesia was also reviewed. The risks                            and benefits of the procedure and the sedation                            options and risks were discussed with the patient.                            All questions were answered, and informed consent                            was obtained. Prior Anticoagulants: The patient                            last took Eliquis (apixaban) 2 days prior to the                            procedure. ASA Grade Assessment: III - A patient                            with severe systemic disease. After reviewing the  risks and benefits, the patient was deemed in                            satisfactory condition to undergo the procedure.                           After obtaining informed consent, the colonoscope                            was passed under direct vision. Throughout the                            procedure, the patient's blood pressure, pulse, and                            oxygen  saturations were monitored continuously. The                            PCF-HQ190L Colonoscope [id number] was introduced                            through the anus and advanced to the the cecum,                            identified by appendiceal orifice and ileocecal                            valve. The colonoscopy was performed without                            difficulty. The patient tolerated the procedure                            well. The quality of the bowel preparation was                            excellent. The ileocecal valve, appendiceal                            orifice, and rectum were photographed. Scope In: 8:12:45 AM Scope Out: 8:29:28 AM Scope Withdrawal Time: 0 hours 11 minutes 3 seconds  Total Procedure Duration: 0 hours 16 minutes 43 seconds  Findings:                 The perianal and digital rectal examinations were                            normal.                           A tattoo was seen in the rectum and in the sigmoid                            colon. A post-polypectomy scar was found at the  tattoo site. There was no evidence of residual                            polyp tissue.                           A few small-mouthed diverticula were found in the                            sigmoid colon.                           Non-bleeding external and internal hemorrhoids were                            found during retroflexion. The hemorrhoids were                            medium-sized. Complications:            No immediate complications. Estimated Blood Loss:     Estimated blood loss was minimal. Impression:               - A tattoo was seen in the rectum and in the                            sigmoid colon. A post-polypectomy scar was found at                            the tattoo site. There was no evidence of residual                            polyp tissue.                           - Diverticulosis in the sigmoid colon.                            - Non-bleeding external and internal hemorrhoids.                           - No specimens collected. Recommendation:           - Patient has a contact number available for                            emergencies. The signs and symptoms of potential                            delayed complications were discussed with the                            patient. Return to normal activities tomorrow.                            Written discharge instructions were provided to the  patient.                           - Resume previous diet.                           - Continue present medications.                           - Repeat colonoscopy in 5 years for surveillance.                           - Resume Eliquis (apixaban) at prior dose today.                            Refer to managing physician for further adjustment                            of therapy. Napoleon Form, MD 12/06/2023 8:36:34 AM This report has been signed electronically.

## 2023-12-06 NOTE — Progress Notes (Signed)
[facility] History and Physical   Primary Care Physician:  Jarrett Soho, PA-C   Reason for Procedure:  History of adenomatous colon polyps  Plan:    Surveillance colonoscopy with possible interventions as needed     HPI: [name] is a very pleasant 53 y.o. female here for surveillance colonoscopy. Denies any nausea, vomiting, abdominal pain, melena or bright red blood per rectum  The risks and benefits as well as alternatives of endoscopic procedure(s) have been discussed and reviewed. All questions answered. The patient agrees to proceed.    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 Authorizing Provider  CAMZYOS 10 MG CAPS capsule Take 10 mg by mouth daily. 11/09/23  Yes [provider]  losartan (COZAAR) 100 MG tablet Take 100 mg by mouth daily. 12/13/21  Yes [provider]  pantoprazole (PROTONIX) 20 MG tablet Take 20 mg by mouth daily.   Yes [provider]  PARoxetine (PAXIL) 30 MG tablet Take 2 tablets (60 mg total) by mouth daily. 06/04/23  Yes Cottle, Steva Ready., MD  rosuvastatin (CRESTOR) 40 MG tablet Take 40 mg by mouth daily.   Yes [provider]   verapamil (CALAN-SR) 120 MG CR tablet Take 120 mg by mouth daily. 11/07/23 11/06/24 Yes [provider]  clonazePAM (KLONOPIN) 0.5 MG tablet Take 1 tablet (0.5 mg total) by mouth 2 (two) times daily as needed for anxiety. 09/24/23   Cottle, Steva Ready., MD  ELIQUIS 5 MG TABS tablet TAKE 1 TABLET BY MOUTH TWICE A DAY 11/07/23   Chandrasekhar, Mahesh A, MD  Omega-3 Fatty Acids (FISH OIL) 1000 MG CAPS Take by mouth.    [provider]    Current Outpatient Medications  Medication Sig Dispense Refill   CAMZYOS 10 MG CAPS capsule Take 10 mg by mouth daily.     losartan (COZAAR) 100 MG tablet Take 100 mg by mouth daily.     pantoprazole (PROTONIX) 20 MG tablet Take 20 mg by mouth daily.     PARoxetine (PAXIL) 30 MG tablet Take 2 tablets (60 mg total) by mouth daily. 180 tablet 3   rosuvastatin (CRESTOR) 40 MG tablet Take 40 mg by mouth daily.     verapamil (CALAN-SR) 120 MG CR tablet Take 120 mg by mouth daily.     clonazePAM (KLONOPIN) 0.5 MG tablet Take 1 tablet (0.5 mg total) by mouth 2 (two) times daily as needed for anxiety. 60 tablet 2   ELIQUIS 5 MG TABS tablet TAKE 1 TABLET BY MOUTH TWICE A DAY 60 tablet 5   Omega-3 Fatty Acids (FISH  OIL) 1000 MG CAPS Take by mouth.     Current Facility-Administered Medications  Medication Dose Route Frequency Provider Last Rate Last Admin   0.9 %  sodium chloride infusion  500 mL Intravenous Continuous Jonah Nestle V, MD       0.9 %  sodium chloride infusion  500 mL Intravenous Once Najeeb Uptain V, MD       0.9 %  sodium chloride infusion  500 mL Intravenous Continuous Vearl Allbaugh, Eleonore Chiquito, MD        Allergies as of 12/06/2023 - Review Complete 12/06/2023  Allergen Reaction Noted   Ciprofloxacin Rash 04/21/1999    Family History  Problem Relation Age of Onset   Hyperlipidemia Mother    Hypertension Mother    Heart attack Mother        May 2017, smoker   GI Bleed Mother        Aug. 2017   Diverticulitis Mother         Aug. 2017   Irritable bowel syndrome Mother    Hypertension Father    Colon polyps Father    Hypertension Sister    Heart disease Maternal Grandmother    Diabetes Mellitus I Maternal Grandmother    Heart attack Paternal Grandfather    Colon cancer Other        mat great uncle, dx in 5's   Breast cancer Maternal Aunt 45   Lung cancer Maternal Aunt 73   Cancer Sister 3       eye tumor/cancer- unk the name   Other Sister        Brain tumors   Esophageal cancer Neg Hx    Stomach cancer Neg Hx    Rectal cancer Neg Hx    Sudden Cardiac Death Neg Hx     Social History   Socioeconomic History   Marital status: Married    Spouse name: Not on file   Number of children: 2   Years of education: Not on file   Highest education level: Not on file  Occupational History   Occupation: Runner, broadcasting/film/video   Tobacco Use   Smoking status: Never   Smokeless tobacco: Never   Tobacco comments:    Never smoke 12/07/22  Vaping Use   Vaping status: Never Used  Substance and Sexual Activity   Alcohol use: No   Drug use: No   Sexual activity: Not on file  Other Topics Concern   Not on file  Social History Narrative   Not on file   Social Drivers of Health   Financial Resource Strain: Not on file  Food Insecurity: Not on file  Transportation Needs: Not on file  Physical Activity: Not on file  Stress: Not on file  Social Connections: Not on file  Intimate Partner Violence: Not on file    Review of Systems:  All other review of systems negative except as mentioned in the HPI.  Physical Exam: Vital signs in last 24 hours: BP 129/84   Pulse 89   Temp 97.9 F (36.6 C)   Ht 5\' 7"  (1.702 m)   Wt 254 lb (115.2 kg)   SpO2 98%   BMI 39.78 kg/m  General:   Alert, NAD Lungs:  Clear .   Heart:  Regular rate and rhythm Abdomen:  Soft, nontender and nondistended. Neuro/Psych:  Alert and cooperative. Normal mood and affect. A and O x 3  Reviewed labs, radiology imaging, old records and  pertinent past GI work up  Patient is appropriate  for planned procedure(s) and anesthesia in an ambulatory setting   K. Scherry Ran , MD [phone]

## 2023-12-06 NOTE — Progress Notes (Signed)
Sedate, gd SR, tolerated procedure well, VSS, report to RN

## 2023-12-07 ENCOUNTER — Telehealth: Payer: Self-pay

## 2023-12-07 DIAGNOSIS — I421 Obstructive hypertrophic cardiomyopathy: Secondary | ICD-10-CM

## 2023-12-07 DIAGNOSIS — I480 Paroxysmal atrial fibrillation: Secondary | ICD-10-CM | POA: Diagnosis not present

## 2023-12-07 NOTE — Telephone Encounter (Signed)
  Follow up Call-     12/06/2023    7:37 AM  Call back number  Post procedure Call Back phone  # [phone]  Permission to leave phone message Yes     Patient questions:  Do you have a fever, pain , or abdominal swelling? No. Pain Score  0 *  Have you tolerated food without any problems? Yes.    Have you been able to return to your normal activities? Yes.    Do you have any questions about your discharge instructions: Diet   No. Medications  No. Follow up visit  No.  Do you have questions or concerns about your Care? No.  Actions: * If pain score is 4 or above: No action needed, pain <4.

## 2023-12-27 ENCOUNTER — Encounter: Payer: Self-pay | Admitting: Internal Medicine

## 2024-01-08 ENCOUNTER — Ambulatory Visit (HOSPITAL_COMMUNITY): Payer: 59

## 2024-01-16 ENCOUNTER — Other Ambulatory Visit: Payer: Self-pay | Admitting: Internal Medicine

## 2024-01-16 ENCOUNTER — Encounter: Payer: Self-pay | Admitting: Internal Medicine

## 2024-01-16 MED ORDER — DIAZEPAM 2 MG PO TABS
2.0000 mg | ORAL_TABLET | Freq: Once | ORAL | 0 refills | Status: AC
Start: 2024-01-16 — End: 2024-01-16

## 2024-01-21 ENCOUNTER — Encounter (HOSPITAL_COMMUNITY): Payer: Self-pay

## 2024-01-22 ENCOUNTER — Other Ambulatory Visit: Payer: Self-pay | Admitting: Internal Medicine

## 2024-01-22 ENCOUNTER — Ambulatory Visit (HOSPITAL_COMMUNITY)
Admission: RE | Admit: 2024-01-22 | Discharge: 2024-01-22 | Disposition: A | Discharge status: Home / Self Care | Source: Ambulatory Visit | Attending: Internal Medicine | Admitting: Internal Medicine

## 2024-01-22 DIAGNOSIS — I480 Paroxysmal atrial fibrillation: Secondary | ICD-10-CM

## 2024-01-22 DIAGNOSIS — E785 Hyperlipidemia, unspecified: Secondary | ICD-10-CM | POA: Insufficient documentation

## 2024-01-22 DIAGNOSIS — I421 Obstructive hypertrophic cardiomyopathy: Secondary | ICD-10-CM | POA: Insufficient documentation

## 2024-01-22 MED ORDER — GADOBUTROL 1 MMOL/ML IV SOLN
10.0000 mL | Freq: Once | INTRAVENOUS | Status: AC | PRN
Start: 2024-01-22 — End: 2024-01-22
  Administered 2024-01-22: 10 mL via INTRAVENOUS

## 2024-01-23 ENCOUNTER — Encounter: Payer: Self-pay | Admitting: Internal Medicine

## 2024-04-21 LAB — CBC
Hematocrit: 44.3 % (ref 34.0–46.6)
Hemoglobin: 13.9 g/dL (ref 11.1–15.9)
MCH: 27.6 pg (ref 26.6–33.0)
MCHC: 31.4 g/dL — ABNORMAL LOW (ref 31.5–35.7)
MCV: 88 fL (ref 79–97)
Platelets: 215 10*3/uL (ref 150–450)
RBC: 5.03 x10E6/uL (ref 3.77–5.28)
RDW: 13.3 % (ref 11.7–15.4)
WBC: 5.1 10*3/uL (ref 3.4–10.8)

## 2024-04-22 LAB — LIPOPROTEIN A (LPA): Lipoprotein (a): 314 nmol/L — ABNORMAL HIGH (ref <75.0)

## 2024-04-22 LAB — LIPID PANEL
Chol/HDL Ratio: 2.7 ratio (ref 0.0–4.4)
Cholesterol, Total: 212 mg/dL — ABNORMAL HIGH (ref 100–199)
HDL: 80 mg/dL (ref >39)
LDL Chol Calc (NIH): 110 mg/dL — ABNORMAL HIGH (ref 0–99)
Triglycerides: 128 mg/dL (ref 0–149)
VLDL Cholesterol Cal: 22 mg/dL (ref 5–40)

## 2024-04-24 ENCOUNTER — Ambulatory Visit: Payer: Self-pay

## 2024-04-24 DIAGNOSIS — E785 Hyperlipidemia, unspecified: Secondary | ICD-10-CM

## 2024-04-24 MED ORDER — EZETIMIBE 10 MG PO TABS: 10.0000 mg | ORAL_TABLET | Freq: Every day | ORAL | 3 refills | Status: AC

## 2024-04-24 NOTE — Addendum Note (Signed)
 Addended by: RANDY HAMP SAILOR on: 04/24/2024 04:14 PM   Modules accepted: Orders

## 2024-06-04 ENCOUNTER — Encounter: Payer: Self-pay | Admitting: Psychiatry

## 2024-06-04 ENCOUNTER — Ambulatory Visit (INDEPENDENT_AMBULATORY_CARE_PROVIDER_SITE_OTHER): Payer: BC Managed Care – PPO | Admitting: Psychiatry

## 2024-06-04 DIAGNOSIS — F410 Panic disorder [episodic paroxysmal anxiety] without agoraphobia: Secondary | ICD-10-CM | POA: Diagnosis not present

## 2024-06-04 DIAGNOSIS — F411 Generalized anxiety disorder: Secondary | ICD-10-CM | POA: Diagnosis not present

## 2024-06-04 MED ORDER — PAROXETINE HCL 30 MG PO TABS: 60.0000 mg | ORAL_TABLET | Freq: Every day | ORAL | 3 refills | Status: AC

## 2024-06-04 MED ORDER — CLONAZEPAM 0.5 MG PO TABS: 0.5000 mg | ORAL_TABLET | Freq: Two times a day (BID) | ORAL | 2 refills | Status: DC | PRN

## 2024-06-04 NOTE — Progress Notes (Signed)
 [name] [id number] 1970/10/28 53 y.o.  Subjective:   Patient ID:  [name] is a 53 y.o. (DOB [birth year]) female.  Chief Complaint:  Chief Complaint  Patient presents with   Follow-up   Anxiety    [name] presents to the office today for follow-up of generalized anxiety disorder and panic disorder without agoraphobia.  Anxiety worse since January 2020.  She was first seen on May 18, 2020.  At that time she was taking sertraline 150 mg daily for 2 months without much change.  She was also taking a low-dose of clonazepam .   She agreed to cross taper to paroxetine  40 mg and to give it at least 8 weeks at that dose to get improved response.  Typically paroxetine  is more effective for panic than is Lexapro she had taken in the past with buspirone .  Also just just suggested increasing buspirone  to 30 mg twice daily to maximize response.  seen January 15, 2020.  Following was noted: NR anxiety with paroxetine  40 for 6-7 weeks plus buspirone  30 BID.  Tolerated. Increase paroxetine  60 mg daily. Schedule Klonopin  0.25 mg BID bc morning anxiety bc awakening with anxiety.  February 19, 1999 2100 appointment, the following is noted: Increased paroxetine  60 mg daily and Klonopin  0.5 mg daily since April 10. OK, but not as well as I want to.  Still needs the clonazepam .    Maybe some benefit from the increase paroxetine .   Crying spells for no reason lately.  Frustrated.  Still gets rushes of anxity with numbness tingling, butterflies in the stomach.  Eating is fine. But still anxiety is worse in the morning. Plan was to start buspirone  due to limited response.  03/25/2020 appointment with the following noted: Better with less anxiety and now not having to take Klonopin  daily.  Last taken it Saturday. No recent panic and anxiety is generally managed.   Tolerating paroxetine  60 mg daily.  Plan: worked well so no change  07/07/20 appt with the following  noted: Pretty good usually.  Overall OK but anxious today without reason and took extra clonazepam .   Not markedly depressed.  Worries over taking clonazepam . Felt she should get more out of 60 mg paroxetine .  More anxious on weekends fiirst thing. No SE paroxetine . Average clonopin is reduced to just every 3 weeks. Lately not enough sleep.   A little more anxious about heart condition. Plan: No med changes  11/03/2020 appointment with following noted: Still on paroxetine  60.  Clonazepam  a little more often than she'd like 3 times weekly at 1/2 tablet over the last 3 weeks.  Sunday woke up with anxiety and needed more.  Tolerates it well.  No panic.  With anxiety stomach gets queasy and tingling in body and generally nervous and sometimes emotional with it out of frustration. Drives me crazy that did so well for so long on just paroxetine  20.  Doesn't feel normal entirely in the last 3 years after going to coworkers funeral that triggered this. Plan: No med changes  03/03/2021 appointment with the following noted: Tough 4 mos mainly related to F's unexpected death. Anxiety wise got through it OK.  That week daily clonazepam .  A little scare for herself with afib end of March corrected. Ongoing cardiac issues.  No excess clonazepam . Parents live 10 min away and helping mother more. Occ flareups of anxiety that pass.   Plan: continue paroxetine  60 and clonazepam  0.5 mg BID prn  09/01/2021 appointment with the  following noted: Infrequent clonazepam  0.25 mg prn. Anxiety fluctuates with more in AUG DT stressors and better since then. Sleep is good.   SE none. No afib since here but still palpitations.   Empty nesters.   Plan no med changes  06/01/22  appt noted:  Continues paroxetine  60 and clonazepam  0.5 mg BID prn.  Not often and when used is 0.25 mg prn. Peach clonazepam  does not work as well (Accord).   Yellow is better. Changing grade levels at school.  Didn't want to do it.   No  panic in a year.  Anxiety usually under control.   06/04/23 appt noted: Continues paroxetine  60 and clonazepam  0.5 mg BID prn rarely used. Doing well overall.  With heart condition gets a little dizzy and clonazepam  0.25 mg prn helped.  Started new heart med, Camzyos.   Some anxiety here and there but no full panic.  Clonazepam  helps when needed.  Patient reports stable mood and denies depressed or irritable moods.    Patient denies difficulty with sleep initiation or maintenance. Denies appetite disturbance.  Patient reports that energy and motivation have been good.  Patient denies any difficulty with concentration.  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 Abt to start school.  Her D is about to move and start Jr at [employer].    Son grad school [address].   06/04/24 appt noted:  Med: Continues paroxetine  60 and clonazepam  0.5 mg BID prn rarely used. For the most part good.  Anxiety manageable.   Wonders if menopause bc more emotional than normal.  Could cry at drop of house.   Son in Chiropractic school.  D Sr at [employer].   She doesn't do well with empty nesting.  Hard time with it.  Kids not home in the summer and won't be likely anymore. 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 Some morning anxiety over pending school year and will have a brand new team so a lot will fall on her.   Rare clonazepam  monthly 0.25 mg. Medical concern hypertrophic cardiomyopathy. Disc perimenopausal sx and approaches to consider.   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  Past Psychiatric Medication Trials: Sertraline 150 no response but inadequate duration Lexapro 30 with buspirone  20 twice daily with some response Paroxetine  60 (took 20 mg for years without relapse) NR anxiety with paroxetine  40 for 6-7 weeks plus buspirone  30 BID. Buspirone  30 BID Low-dose clonazepam  (Accord less effective than yellow one)  Review of Systems:  Review of Systems  Respiratory:  Negative for chest tightness and shortness of breath.   Cardiovascular:   Positive for palpitations. Negative for chest pain.  Neurological:  Negative for dizziness, tremors and weakness.  Psychiatric/Behavioral:  Negative for dysphoric mood.    DX HYPERTROPHIC OBSTRUCTIVE CARDIOMYOPATHY  Medications: I have reviewed the patient's current medications.  Current Outpatient Medications  Medication Sig Dispense Refill   CAMZYOS 10 MG CAPS capsule Take 10 mg by mouth daily.     clonazePAM  (KLONOPIN ) 0.5 MG tablet Take 1 tablet (0.5 mg total) by mouth 2 (two) times daily as needed for anxiety. 60 tablet 2   ELIQUIS  5 MG TABS tablet TAKE 1 TABLET BY MOUTH TWICE A DAY 60 tablet 5   ezetimibe  (ZETIA ) 10 MG tablet Take 1 tablet (10 mg total) by mouth daily. 90 tablet 3   losartan (COZAAR) 100 MG tablet Take 100 mg by mouth daily.     Omega-3 Fatty Acids (FISH OIL) 1000 MG CAPS Take by mouth.     pantoprazole (PROTONIX)  20 MG tablet Take 20 mg by mouth daily.     PARoxetine  (PAXIL ) 30 MG tablet Take 2 tablets (60 mg total) by mouth daily. 180 tablet 3   rosuvastatin (CRESTOR) 40 MG tablet Take 40 mg by mouth daily.     verapamil (CALAN-SR) 120 MG CR tablet Take 120 mg by mouth daily.     No current facility-administered medications for this visit.    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 2017   Diverticulitis Mother        Aug. 2017   Irritable bowel syndrome Mother    Hypertension Father    Colon polyps Father    Hypertension Sister    Heart disease Maternal Grandmother    Diabetes Mellitus I Maternal Grandmother    Heart attack Paternal Grandfather    Colon cancer Other        mat great uncle, dx in 34's   Breast cancer Maternal Aunt 45   Lung cancer Maternal Aunt 78   Cancer Sister 3       eye tumor/cancer- unk the name   Other Sister        Brain tumors   Esophageal cancer Neg Hx    Stomach cancer Neg Hx    Rectal cancer Neg Hx    Sudden Cardiac Death Neg Hx     Social History   Socioeconomic History   Marital status: Married    Spouse name: Not on file   Number of children: 2   Years of education: Not on file   Highest education level: Not on file  Occupational History   Occupation: Runner, broadcasting/film/video   Tobacco Use   Smoking status: Never   Smokeless tobacco: Never   Tobacco comments:    Never smoke 12/07/22  Vaping Use   Vaping status: Never Used  Substance and Sexual Activity   Alcohol use: No   Drug use: No   Sexual activity: Not on file  Other Topics Concern   Not on file  Social History Narrative   Not on file   Social Drivers of Health   Financial Resource Strain: Not on file  Food Insecurity: Not on file  Transportation Needs: Not on file  Physical Activity: Not on file  Stress: Not on file  Social Connections: Not on file  Intimate Partner Violence: Not on file    Past Medical History, Surgical history, Social history, and Family history were reviewed and updated as appropriate.   Please see review  of systems for further details on the patient's review from today.   Objective:   Physical Exam:  There were no vitals taken for this visit.  Physical Exam Constitutional:      General: She is not  in acute distress. Musculoskeletal:        General: No deformity.  Neurological:     Mental Status: She is alert and oriented to person, place, and time.     Coordination: Coordination normal.  Psychiatric:        Attention and Perception: Attention and perception normal. She does not perceive auditory or visual hallucinations.        Mood and Affect: Mood is anxious. Mood is not depressed. Affect is not labile or tearful.        Speech: Speech normal.        Behavior: Behavior normal.        Thought Content: Thought content normal. Thought content is not paranoid or delusional. Thought content does not include homicidal or suicidal ideation. Thought content does not include suicidal plan.        Cognition and Memory: Cognition and memory normal.        Judgment: Judgment normal.     Comments: Insight intact      Lab Review:     Component Value Date/Time   NA 140 08/22/2023 1607   NA 139 10/07/2019 0903   K 4.1 08/22/2023 1607   CL 103 08/22/2023 1607   CO2 30 08/22/2023 1607   GLUCOSE 95 08/22/2023 1607   BUN 23 08/22/2023 1607   BUN 13 10/07/2019 0903   CREATININE 1.15 08/22/2023 1607   CALCIUM 9.9 08/22/2023 1607   PROT 7.3 08/22/2023 1607   PROT 6.3 10/07/2019 0903   ALBUMIN 4.3 08/22/2023 1607   ALBUMIN 4.1 10/07/2019 0903   AST 17 08/22/2023 1607   ALT 13 08/22/2023 1607   ALKPHOS 63 08/22/2023 1607   BILITOT 0.6 08/22/2023 1607   BILITOT 0.8 10/07/2019 0903   GFRNONAA >60 03/02/2021 1541   GFRAA 85 10/07/2019 0903       Component Value Date/Time   WBC 5.1 04/21/2024 0814   WBC 7.6 08/22/2023 1607   RBC 5.03 04/21/2024 0814   RBC 4.66 08/22/2023 1607   HGB 13.9 04/21/2024 0814   HCT 44.3 04/21/2024 0814   PLT 215 04/21/2024 0814   MCV 88 04/21/2024 0814   MCH 27.6 04/21/2024 0814   MCH 29.3 03/02/2021 1541   MCHC 31.4 (L) 04/21/2024 0814   MCHC 32.2 08/22/2023 1607   RDW 13.3 04/21/2024 0814   LYMPHSABS 1.6 08/22/2023 1607   MONOABS 0.5 08/22/2023  1607   EOSABS 0.0 08/22/2023 1607   BASOSABS 0.1 08/22/2023 1607    No results found for: POCLITH, LITHIUM   No results found for: PHENYTOIN, PHENOBARB, VALPROATE, CBMZ   .res Assessment: Plan:    Scotlyn was seen today for follow-up and anxiety.  Diagnoses and all orders for this visit:  Generalized anxiety disorder  Panic disorder without agoraphobia    DX HYPERTROPHIC OBSTRUCTIVE CARDIOMYOPATHY   30 min for chronic anxiety.  No recent panic .  Some anxiety NR anxiety with paroxetine  40 for 6-7 weeks plus buspirone  30 BID.  Tolerated. However paroxetine  60 mg daily worked pretty well after 6-8 weeks.. Disc SE  Disc SE in detail and SSRI withdrawal sx. She has dizziness as part of the heart condition. Prn clonazepam  helps at 0.25 mg.  Continue clonazepam  0.5 mg  prn (TEVA works  but not Accord)  She has been able to reduce the clonazepam  from scheduled to as needed and has not needed to take it nearly as often.  No med changes are indicated.  But she gets frustrated at having to use it bc feels like a failure. Disc differences in generics noticeable at low doses.  She doesn't like Accord as much.  (TEVA yellow works but not Accord)  Option abilify potentiation if needed.  Disc activity and anxiety.  Disc menopause and psych sx and options.  We discussed the short-term risks associated with benzodiazepines including sedation and increased fall risk among others.  Discussed long-term side effect risk including dependence, potential withdrawal symptoms, and the potential eventual dose-related risk of dementia.  But recent studies from 2020 dispute this association between benzodiazepines and dementia risk. Newer studies in 2020 do not support an association with dementia.  No med changes.   FU 12 mos bc stable.  Lorene Macintosh, MD, DFAPA '  Please see After Visit Summary for patient specific instructions.  No future appointments.    No orders of the  defined types were placed in this encounter.   -------------------------------

## 2024-06-06 ENCOUNTER — Other Ambulatory Visit: Payer: Self-pay | Admitting: Obstetrics and Gynecology

## 2024-06-06 ENCOUNTER — Other Ambulatory Visit: Payer: Self-pay

## 2024-06-06 ENCOUNTER — Telehealth: Payer: Self-pay | Admitting: Psychiatry

## 2024-06-06 DIAGNOSIS — Z1231 Encounter for screening mammogram for malignant neoplasm of breast: Secondary | ICD-10-CM

## 2024-06-06 DIAGNOSIS — F410 Panic disorder [episodic paroxysmal anxiety] without agoraphobia: Secondary | ICD-10-CM

## 2024-06-06 DIAGNOSIS — F411 Generalized anxiety disorder: Secondary | ICD-10-CM

## 2024-06-06 MED ORDER — CLONAZEPAM 0.5 MG PO TABS: 0.5000 mg | ORAL_TABLET | Freq: Two times a day (BID) | ORAL | 0 refills | Status: AC | PRN

## 2024-06-06 NOTE — Telephone Encounter (Signed)
 Canceled [facility] [address] and rep[facility] to [facility] [address].

## 2024-06-06 NOTE — Telephone Encounter (Signed)
 Pt needs specific manf.(Teva) For Klonopin. She asks to send it to [facility] [address]

## 2024-06-25 ENCOUNTER — Ambulatory Visit
Admission: RE | Admit: 2024-06-25 | Discharge: 2024-06-25 | Disposition: A | Discharge status: Home / Self Care | Source: Ambulatory Visit | Attending: Obstetrics and Gynecology | Admitting: Obstetrics and Gynecology

## 2024-06-25 DIAGNOSIS — Z1231 Encounter for screening mammogram for malignant neoplasm of breast: Secondary | ICD-10-CM

## 2024-07-29 ENCOUNTER — Encounter: Payer: Self-pay | Admitting: Internal Medicine

## 2024-08-08 LAB — LIPID PANEL
Chol/HDL Ratio: 2.6 ratio (ref 0.0–4.4)
Cholesterol, Total: 158 mg/dL (ref 100–199)
HDL: 60 mg/dL (ref >39)
LDL Chol Calc (NIH): 79 mg/dL (ref 0–99)
Triglycerides: 102 mg/dL (ref 0–149)
VLDL Cholesterol Cal: 19 mg/dL (ref 5–40)

## 2024-08-08 LAB — ALT: ALT: 20 IU/L (ref 0–32)

## 2024-08-11 ENCOUNTER — Ambulatory Visit: Payer: Self-pay

## 2025-01-30 ENCOUNTER — Ambulatory Visit: Admitting: Internal Medicine

## 2025-06-04 ENCOUNTER — Ambulatory Visit: Admitting: Psychiatry
